# Patient Record
Sex: Male | Born: 1985 | Race: Black or African American | Hispanic: No | Marital: Married | Smoking: Current every day smoker
Health system: Southern US, Community
[De-identification: ages and names within clinical notes are randomized; demographics above are authoritative.]

## PROBLEM LIST (undated history)

## (undated) DIAGNOSIS — J45909 Unspecified asthma, uncomplicated: Secondary | ICD-10-CM

---
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
## 2004-03-18 ENCOUNTER — Emergency Department (HOSPITAL_COMMUNITY): Admission: EM | Admit: 2004-03-18 | Discharge: 2004-03-18 | Payer: Self-pay | Admitting: *Deleted

## 2005-06-26 ENCOUNTER — Emergency Department (HOSPITAL_COMMUNITY): Admission: EM | Admit: 2005-06-26 | Discharge: 2005-06-27 | Payer: Self-pay | Admitting: Emergency Medicine

## 2006-04-02 ENCOUNTER — Emergency Department (HOSPITAL_COMMUNITY): Admission: EM | Admit: 2006-04-02 | Discharge: 2006-04-02 | Payer: Self-pay | Admitting: Emergency Medicine

## 2006-04-08 ENCOUNTER — Emergency Department (HOSPITAL_COMMUNITY): Admission: EM | Admit: 2006-04-08 | Discharge: 2006-04-08 | Payer: Self-pay | Admitting: Emergency Medicine

## 2008-09-07 ENCOUNTER — Emergency Department (HOSPITAL_COMMUNITY): Admission: EM | Admit: 2008-09-07 | Discharge: 2008-09-07 | Payer: Self-pay | Admitting: Family Medicine

## 2011-03-10 ENCOUNTER — Encounter (HOSPITAL_COMMUNITY): Payer: Self-pay | Admitting: Family Medicine

## 2011-03-10 ENCOUNTER — Emergency Department (HOSPITAL_COMMUNITY)
Admission: EM | Admit: 2011-03-10 | Discharge: 2011-03-10 | Discharge status: Against Medical Advice | Payer: Medicaid Other | Attending: Emergency Medicine | Admitting: Emergency Medicine

## 2011-03-10 DIAGNOSIS — F17200 Nicotine dependence, unspecified, uncomplicated: Secondary | ICD-10-CM | POA: Insufficient documentation

## 2011-03-10 DIAGNOSIS — M7989 Other specified soft tissue disorders: Secondary | ICD-10-CM | POA: Insufficient documentation

## 2011-03-10 NOTE — ED Notes (Signed)
Per pt leg swelling for 3 days. Denies any other symptoms

## 2011-03-10 NOTE — ED Notes (Signed)
Pt called with no response.  Will call in 5 minutes.

## 2011-03-10 NOTE — ED Provider Notes (Signed)
History     CSN: [id number]  Arrival date & time 03/10/11  1847   None     Chief Complaint  Patient presents with  . Leg Swelling    (Consider location/radiation/quality/duration/timing/severity/associated sxs/prior treatment) HPI  History reviewed. No pertinent past medical history.  History reviewed. No pertinent past surgical history.  History reviewed. No pertinent family history.  History  Substance Use Topics  . Smoking status: Current Everyday Smoker  . Smokeless tobacco: Not on file  . Alcohol Use: Yes      Review of Systems  Allergies  Review of patient's allergies indicates no known allergies.  Home Medications  No current outpatient prescriptions on file.  BP 116/69  Pulse 89  Temp(Src) 98.1 F (36.7 C) (Oral)  Resp 20  SpO2 98%  Physical Exam  ED Course  Procedures (including critical care time)  Labs Reviewed - No data to display No results found.   No diagnosis found.    MDM  9:50pm I have personally into the room 3 times and the patient is nowhere to be found. We will proceed to take him out of the computer because he's not responding to our request and triaged. I never saw this patient.        Jethro Bastos, NP 03/11/11 1948

## 2011-03-12 NOTE — ED Provider Notes (Signed)
Medical screening examination/treatment/procedure(s) were performed by non-physician practitioner and as supervising physician I was immediately available for consultation/collaboration.   Joshuajames Moehring L Aniello Christopoulos, MD 03/12/11 1433 

## 2011-04-03 ENCOUNTER — Emergency Department (INDEPENDENT_AMBULATORY_CARE_PROVIDER_SITE_OTHER)
Admission: EM | Admit: 2011-04-03 | Discharge: 2011-04-03 | Disposition: A | Discharge status: Home / Self Care | Payer: Self-pay | Source: Home / Self Care | Attending: Emergency Medicine | Admitting: Emergency Medicine

## 2011-04-03 ENCOUNTER — Encounter (HOSPITAL_COMMUNITY): Payer: Self-pay | Admitting: *Deleted

## 2011-04-03 DIAGNOSIS — H6120 Impacted cerumen, unspecified ear: Secondary | ICD-10-CM

## 2011-04-03 DIAGNOSIS — J020 Streptococcal pharyngitis: Secondary | ICD-10-CM

## 2011-04-03 LAB — POCT RAPID STREP A: Streptococcus, Group A Screen (Direct): POSITIVE — AB

## 2011-04-03 MED ORDER — PENICILLIN V POTASSIUM 500 MG PO TABS: 500.0000 mg | ORAL_TABLET | Freq: Four times a day (QID) | ORAL | Status: AC

## 2011-04-03 NOTE — ED Provider Notes (Signed)
Chief Complaint  Patient presents with  . Sore Throat    History of Present Illness:   The patient is a 26 year old male who has had a five-day history of sore throat, pain on swallowing, swollen glands, sweats, nasal congestion, rhinorrhea, yellow drainage, sneezing, left ear pain and congestion.  Review of Systems:  Other than noted above, the patient denies any of the following symptoms. Systemic:  No fever, chills, sweats, fatigue, myalgias, headache, or anorexia. Eye:  No redness, pain or drainage. ENT:  No earache, nasal congestion, rhinorrhea, sinus pressure, or sore throat. Lungs:  No cough, sputum production, wheezing, shortness of breath. Or chest pain. GI:  No nausea, vomiting, abdominal pain or diarrhea. Skin:  No rash or itching.  PMFSH:  Past medical history, family history, social history, meds, and allergies were reviewed.  Physical Exam:   Vital signs:  BP 113/68  Pulse 76  Temp(Src) 98.6 F (37 C) (Oral)  Resp 16  SpO2 98% General:  Alert, in no distress. Eye:  No conjunctival injection or drainage. ENT:  There is a cerumen impaction in the left ear canal. This was irrigated clear and thereafter the tympanic membrane appeared normal. There was some bleeding after irrigation of the ear. The right TM and canal were normal.  Nasal mucosa was clear and uncongested, without drainage.  Mucous membranes were moist.  Tonsils were enlarged and red with spots of whitish exudate particularly on the right side and there was thick, yellow postnasal drip.  There were no oral ulcerations or lesions. Neck:  Supple, no adenopathy, tenderness or mass. Lungs:  No respiratory distress.  Lungs were clear to auscultation, without wheezes, rales or rhonchi.  Breath sounds were clear and equal bilaterally. Heart:  Regular rhythm, without gallops, murmers or rubs. Skin:  Clear, warm, and dry, without rash or lesions.  Labs:   Results for orders placed during the hospital encounter of  04/03/11  POCT RAPID STREP A ([facility] ONLY)      Component Value Range   Streptococcus, Group A Screen (Direct) POSITIVE (*) NEGATIVE      Course in Urgent Care Center:   The left ear canal was irrigated clear. Thereafter the TM appeared normal but there was some bleeding from the ear canal.    Assessment:   Diagnoses that have been ruled out:  None  Diagnoses that are still under consideration:  None  Final diagnoses:  Strep throat  Impacted cerumen      Plan:   1.  The following meds were prescribed:   New Prescriptions   PENICILLIN V POTASSIUM (VEETID) 500 MG TABLET    Take 1 tablet (500 mg total) by mouth 4 (four) times daily.   2.  The patient was instructed in symptomatic care and handouts were given. 3.  The patient was told to return if becoming worse in any way, if no better in 3 or 4 days, and given some red flag symptoms that would indicate earlier return.   Reuben Likes, MD 04/03/11 [phone]

## 2011-04-03 NOTE — ED Notes (Signed)
Jason Johnston  Has  Symptoms  Of  sorethroat  Cough  l  Earache  And  A  Cough  X   4  Days        Sitting upright on  Exam table        speaking in  Complete  sentances

## 2011-04-03 NOTE — Discharge Instructions (Signed)
Cerumen Impaction A cerumen impaction is when the wax in your ear forms a plug. This plug usually causes reduced hearing. Sometimes it also causes an earache or dizziness. Removing a cerumen impaction can be difficult and painful. The wax sticks to the ear canal. The canal is sensitive and bleeds easily. If you try to remove a heavy wax buildup with a cotton tipped swab, you may push it in further. Irrigation with water, suction, and small ear curettes may be used to clear out the wax. If the impaction is fixed to the skin in the ear canal, ear drops may be needed for a few days to loosen the wax. People who build up a lot of wax frequently can use ear wax removal products available in your local drugstore. SEEK MEDICAL CARE IF:  You develop an earache, increased hearing loss, or marked dizziness. Document Released: 02/15/2004 Document Revised: 12/27/2010 Document Reviewed: 04/06/2009 [facility] Patient Information 2012 [place], [place].Strep Throat Strep throat is an infection of the throat caused by a bacteria named Streptococcus pyogenes. Your caregiver may call the infection streptococcal "tonsillitis" or "pharyngitis" depending on whether there are signs of inflammation in the tonsils or back of the throat. Strep throat is most common in children from 26 to 90 years old during the cold months of the year, but it can occur in people of any age during any season. This infection is spread from person to person (contagious) through coughing, sneezing, or other close contact. SYMPTOMS   Fever or chills.   Painful, swollen, red tonsils or throat.   Pain or difficulty when swallowing.   White or yellow spots on the tonsils or throat.   Swollen, tender lymph nodes or "glands" of the neck or under the jaw.   Red rash all over the body (rare).  DIAGNOSIS  Many different infections can cause the same symptoms. A test must be done to confirm the diagnosis so the right treatment can be given. A "rapid  strep test" can help your caregiver make the diagnosis in a few minutes. If this test is not available, a light swab of the infected area can be used for a throat culture test. If a throat culture test is done, results are usually available in a day or two. TREATMENT  Strep throat is treated with antibiotic medicine. HOME CARE INSTRUCTIONS   Gargle with 1 tsp of salt in 1 cup of warm water, 3 to 4 times per day or as needed for comfort.   Family members who also have a sore throat or fever should be tested for strep throat and treated with antibiotics if they have the strep infection.   Make sure everyone in your household washes their hands well.   Do not share food, drinking cups, or personal items that could cause the infection to spread to others.   You may need to eat a soft food diet until your sore throat gets better.   Drink enough water and fluids to keep your urine clear or pale yellow. This will help prevent dehydration.   Get plenty of rest.   Stay home from school, daycare, or work until you have been on antibiotics for 24 hours.   Only take over-the-counter or prescription medicines for pain, discomfort, or fever as directed by your caregiver.   If antibiotics are prescribed, take them as directed. Finish them even if you start to feel better.  SEEK MEDICAL CARE IF:   The glands in your neck continue to enlarge.  You develop a rash, cough, or earache.   You cough up green, yellow-brown, or bloody sputum.   You have pain or discomfort not controlled by medicines.   Your problems seem to be getting worse rather than better.  SEEK IMMEDIATE MEDICAL CARE IF:   You develop any new symptoms such as vomiting, severe headache, stiff or painful neck, chest pain, shortness of breath, or trouble swallowing.   You develop severe throat pain, drooling, or changes in your voice.   You develop swelling of the neck, or the skin on the neck becomes red and tender.   You have  a fever.   You develop signs of dehydration, such as fatigue, dry mouth, and decreased urination.   You become increasingly sleepy, or you cannot wake up completely.  Document Released: 01/05/2000 Document Revised: 12/27/2010 Document Reviewed: 03/08/2010 [employer] Patient Information 2012 [place], [place].

## 2012-04-10 ENCOUNTER — Emergency Department (HOSPITAL_COMMUNITY)
Admission: EM | Admit: 2012-04-10 | Discharge: 2012-04-10 | Disposition: A | Discharge status: Home / Self Care | Payer: BC Managed Care – PPO | Attending: Emergency Medicine | Admitting: Emergency Medicine

## 2012-04-10 ENCOUNTER — Encounter (HOSPITAL_COMMUNITY): Payer: Self-pay | Admitting: *Deleted

## 2012-04-10 DIAGNOSIS — H6692 Otitis media, unspecified, left ear: Secondary | ICD-10-CM

## 2012-04-10 DIAGNOSIS — H6690 Otitis media, unspecified, unspecified ear: Secondary | ICD-10-CM | POA: Insufficient documentation

## 2012-04-10 MED ORDER — IBUPROFEN 400 MG PO TABS
800.0000 mg | ORAL_TABLET | Freq: Once | ORAL | Status: AC
  Administered 2012-04-10: 800 mg via ORAL
  Filled 2012-04-10: qty 2

## 2012-04-10 MED ORDER — AMOXICILLIN 500 MG PO CAPS: 500.0000 mg | ORAL_CAPSULE | Freq: Two times a day (BID) | ORAL | Status: DC

## 2012-04-10 NOTE — Discharge Instructions (Signed)
Otitis Media, Adult A middle ear infection is an infection in the space behind the eardrum. The medical name for this is "otitis media." It may happen after a common cold. It is caused by a germ that starts growing in that space. You may feel swollen glands in your neck on the side of the ear infection. HOME CARE INSTRUCTIONS   Take your medicine as directed until it is gone, even if you feel better after the first few days.  Only take over-the-counter or prescription medicines for pain, discomfort, or fever as directed by your caregiver.  Occasional use of a nasal decongestant a couple times per day may help with discomfort and help the eustachian tube to drain better. Follow up with your caregiver in 10 to 14 days or as directed, to be certain that the infection has cleared. Not keeping the appointment could result in a chronic or permanent injury, pain, hearing loss and disability. If there is any problem keeping the appointment, you must call back to this facility for assistance. SEEK IMMEDIATE MEDICAL CARE IF:   You are not getting better in 2 to 3 days.  You have pain that is not controlled with medication.  You feel worse instead of better.  You cannot use the medication as directed.  You develop swelling, redness or pain around the ear or stiffness in your neck. MAKE SURE YOU:   Understand these instructions.  Will watch your condition.  Will get help right away if you are not doing well or get worse. Document Released: 10/13/2003 Document Revised: 04/01/2011 Document Reviewed: 08/14/2007 [facility] Patient Information 2013 [place], [place].  Antibiotic Nonuse  Your caregiver felt that the infection or problem was not one that would be helped with an antibiotic. Infections may be caused by viruses or bacteria. Only a caregiver can tell which one of these is the likely cause of an illness. A cold is the most common cause of infection in both adults and children. A cold is a  virus. Antibiotic treatment will have no effect on a viral infection. Viruses can lead to many lost days of work caring for sick children and many missed days of school. Children may catch as many as 10 "colds" or "flus" per year during which they can be tearful, cranky, and uncomfortable. The goal of treating a virus is aimed at keeping the ill person comfortable. Antibiotics are medications used to help the body fight bacterial infections. There are relatively few types of bacteria that cause infections but there are hundreds of viruses. While both viruses and bacteria cause infection they are very different types of germs. A viral infection will typically go away by itself within 7 to 10 days. Bacterial infections may spread or get worse without antibiotic treatment. Examples of bacterial infections are:  Sore throats (like strep throat or tonsillitis).  Infection in the lung (pneumonia).  Ear and skin infections. Examples of viral infections are:  Colds or flus.  Most coughs and bronchitis.  Sore throats not caused by Strep.  Runny noses. It is often best not to take an antibiotic when a viral infection is the cause of the problem. Antibiotics can kill off the helpful bacteria that we have inside our body and allow harmful bacteria to start growing. Antibiotics can cause side effects such as allergies, nausea, and diarrhea without helping to improve the symptoms of the viral infection. Additionally, repeated uses of antibiotics can cause bacteria inside of our body to become resistant. That resistance can be  passed onto harmful bacterial. The next time you have an infection it may be harder to treat if antibiotics are used when they are not needed. Not treating with antibiotics allows our own immune system to develop and take care of infections more efficiently. Also, antibiotics will work better for [place] when they are prescribed for bacterial infections. Treatments for a child that is ill may  include:  Give extra fluids throughout the day to stay hydrated.  Get plenty of rest.  Only give your child over-the-counter or prescription medicines for pain, discomfort, or fever as directed by your caregiver.  The use of a cool mist humidifier may help stuffy noses.  Cold medications if suggested by your caregiver. Your caregiver may decide to start you on an antibiotic if:  The problem you were seen for today continues for a longer length of time than expected.  You develop a secondary bacterial infection. SEEK MEDICAL CARE IF:  Fever lasts longer than 5 days.  Symptoms continue to get worse after 5 to 7 days or become severe.  Difficulty in breathing develops.  Signs of dehydration develop (poor drinking, rare urinating, dark colored urine).  Changes in behavior or worsening tiredness (listlessness or lethargy). Document Released: 03/18/2001 Document Revised: 04/01/2011 Document Reviewed: 09/14/2008 [facility] Patient Information 2013 [place], [place].  RESOURCE GUIDE  Chronic Pain Problems: Contact [facility]  [phone] Patients need to be referred by their primary care doctor.  Insufficient Money for Medicine: Contact United Way:  call "211."   No Primary Care Doctor: - Call Health Connect  [phone] - can help you locate a primary care doctor that  accepts your insurance, provides certain services, etc. - Physician Referral Service- [phone]  Agencies that provide inexpensive medical care: - [facility]  [phone] - [facility]  [phone] - [facility]  [phone] - Women's Clinic  [phone] - Planned Parenthood  [phone] [facility]  [phone]  Medicaid-accepting [facility] Providers: - [facility]- [address]  [phone], Mon-Fri 9am-7pm, Sat 9am-1pm - [facility]- [address] [place], [address] [place]  [phone] - [facility]- [address], [address] [place]  [phone] [facility]- [address]  [phone] - Renaye Rakers- [address] [place], [address], [phone]  Only accepts [place] patients after they have their name  applied to their card  Self Pay (no insurance) in [place]: - Sickle Cell Patients: Dr Willey Blade, [facility]  [address] [place], [phone] - [facility]- [address] [place]  [phone]       [facility]- [address]       -     [facility]- see information above (Speak to [employer] if you do not have insurance)       -  [facility]- [address],  [phone]       -  [facility]- [address], [phone]       -  Dr Julio Sicks-  [address], [place], [phone]       -  [facility]gent [facility] - [address] [phone]       -  [facility]- [address] [phone], also [address]  [address] [phone]       -    [facility]- [address] [place], [phone], 1st & 3rd Saturday        every month, 10am-1pm  [facility] [address] [place], [place] [address] [phone]  [facility] [address] [place], [place] [address] [phone]  1) Find a Doctor and Pay Out of Pocket Although you won't have to find out who is covered by your insurance plan, it is a good idea to ask around and get recommendations. You will then need to call the office and see if the doctor you have chosen will accept you as a new patient and what types of options they offer for patients who are self-pay. Some doctors offer discounts or will set up payment plans for their patients who do not have insurance, but you will need to ask so you aren't surprised when you get to your appointment.  2) Contact Your Local Health Department Not all health  departments have doctors that can see patients for sick visits, but many do, so it is worth a call to see if yours does. If you don't know where your local health department is, you can check in your phone book. The CDC also has a tool to help you locate your state's health department, and many state websites also have listings of all of their local health departments.  3) Find a Walk-in Clinic If your illness is not likely to be very severe or complicated, you may want to try a walk in clinic. These are popping up all over the country in pharmacies, drugstores, and shopping centers. They're usually staffed by nurse practitioners or physician assistants that have been trained to treat common illnesses and complaints. They're usually fairly quick and inexpensive. However, if you have serious medical issues or chronic medical problems, these are probably not your best option  STD Testing - [facility], STD Clinic, [address] [place], phone [phone] or [phone].  Monday - Friday, call for an appointment. [facility] of [employer], STD Clinic, [address], [place], phone [phone] or [phone].  Monday - Friday, call for an appointment.  Abuse/Neglect: [facility] Child Abuse Hotline 3431208179 [facility] Child Abuse Hotline [phone] (After Hours)  Emergency Shelter:  [facility] [phone]  Maternity Homes: - [facility] [place] [facility] [phone] - [facility] [phone]  MRSA Hotline #:   813 676 8875  Dental Assistance If unable to pay or uninsured, contact:  [facility]. to become qualified for the adult dental clinic.  Patients with Medicaid: [facility] [phone] [address], [phone] [address] [phone]  If unable to pay, or uninsured, contact [facility] [phone] in [place], [phone] in [facility]) to become qualified for the adult dental clinic  [facility] [address] [place], [place] [address] [phone] [email or web address]  Other Proofreader Services: - [facility]- [address] [place], [place], [place], [address], [phone], Ext. [phone], 2nd and 4th Thursday of the month at 6:30am.  10 clients each day by appointment, can sometimes see walk-in patients if someone does not show for an appointment. [facility]- [address] [place], [place], [address], [phone] - [facility]- [address] [place], [place], [address], [phone] -  [facility] Health Department- [phone] - [facility] Health Department- [phone] [facility] Health [phone]       Behavioral Health Resources in the [facility]  Intensive Outpatient Programs: [facility]      [address] [place], [place] [phone] Both a day and evening program       [facility] Outpatient     [address]        [place], [place] [address] [phone]         ADS: [facility] [address] [place] [phone]  [facility] ACCESS LINE: [phone] or [phone] [address] [place], [place] [address] [email or web address]   Substance Abuse Resources: - [facility]  [phone] - [facility] [phone] - [facility] [phone] Floydene Flock [phone] - Residential & Outpatient Substance Abuse Program  [phone]  Psychological Services: [facility]  [phone] [facility]  [phone] - [facility], [phone] [place]. [address] [place], ACCESS LINE: [phone] or [phone], [email or web address]  Mobile Crisis Teams:                                         Therapeutic Alternatives         Mobile Crisis Care Unit [phone]             [facility] [address] [place]ette Otto [phone]                                         Interventionist [address] [place] [phone]  Self-Help/Support Groups: Mental Health Assoc. of [employer] of support groups [phone] (call for more info)   Narcotics Anonymous (NA) Caring [facility] [address] [place] - 2 meetings at this location  Residential Treatment Programs:  [facility]      [address]        [place]       [phone]         [facility] [address], [place] [address]83 [place], [place]  [address] [phone]  [facility]  [address] [place], [place] [address] [phone] Admissions: 8am-3pm M-F  [facility]     [address]        [place], [place] [address]       [phone]         [facility] [address] [place], [place] [phone]  [facility] [address] [place], [place] [phone]  [facility] - Intensive Outpatient      [address]     [place], [place]       [phone]         [facility] ([facility]     [address] [place], [place] [phone] or [phone]  Residential Treatment Services (RTS), Medicaid [address] [place], [place] [phone]  [facility] Margo Aye  [address] [place] [phone]  [facility] Resources: [phone]               General Therapy                                                Angie Fava, PhD        [address] [place], [place] [address]         [phone]   Insurance  [facility]   [address] [place], [place] [address] [phone]  [facility] [address] [place], [place] [address] [phone] Insurance/Medicaid/sponsorship through [facility]                                              [address]                                        [place], [place] [address]    Therapy/tele-psych/case         [phone]          [facility] [address], [place]  [address]  Adolescent/group home/case management [phone]                                           Creola Corn PhD       General therapy       Insurance   [phone]         Dr. Lolly Mustache, Insurance, M-F [phone]  [facility] [employer]  [employer] [facility]. [address]                 [address]         [address] [place] [address]  Blondell Reveal Phone:  [phone]                                  Phone:  [phone]                   Phone:  [phone]  [facility], [phone] - [facility] - [phone]       -     Tressie Ellis  [facility] in [place], [address],             [place] [phone] or [phone] (After Hours)

## 2012-04-10 NOTE — ED Provider Notes (Signed)
History     CSN: [id number]  Arrival date & time 04/10/12  2010   First MD Initiated Contact with Patient 04/10/12 2133      Chief Complaint  Patient presents with  . Otalgia    (Consider location/radiation/quality/duration/timing/severity/associated sxs/prior Treatment)  SUBJECTIVE: Jason Johnston is a 27 y.o. male brought by himself with 2 week(s) history of left ear pain, no fever, no drainage, persistent pain on left, without drainage. Pain is not radiating to the jaw or behind the ear. Denies fevers, chills, nausea, vomiting, diarrhea, hearing loss, mastoid swelling, cervical lympadenopathy, trismus. No history of recurrent ear infections.   Patient is a 27 y.o. male presenting with ear pain.  Otalgia   No past medical history on file.  No past surgical history on file.  No family history on file.  History  Substance Use Topics  . Smoking status: Current Every Day Smoker    Types: Cigarettes  . Smokeless tobacco: Not on file  . Alcohol Use: Yes      Review of Systems  Constitutional: Negative for chills and appetite change.  HENT: Positive for ear pain.   Eyes: Negative for pain.  Musculoskeletal: Negative for back pain.  Neurological: Negative for dizziness and light-headedness.   Physical exam BP 124/91  Pulse 65  Temp(Src) 98 F (36.7 C) (Oral)  Resp 16  SpO2 96% General appearance: alert, well appearing, and in no distress and oriented to person, place, and time.   Ears: right ear normal, left TM red, dull, bulging with air fluid level noted, hearing grossly normal bilaterally. No pinna displacement bilaterally. No mastoid tenderness noted.  Nose: normal and patent, no erythema, discharge or polyps Oropharynx: mucous membranes moist, pharynx normal without lesions, tongue normal, TMJ exam normal, no tenderness, normal excursion and left third to last molar is fracture (happened 2 years ago) No erythematous gums, discharge, indurated or fluctuant gums  noted. Neck: supple, no significant adenopathy Lungs: clear to auscultation, no wheezes, rales or rhonchi, symmetric air entry  ASSESSMENT: Otitis Media  PLAN: 1) See orders for this visit as documented in the electronic medical record. 2) Symptomatic therapy suggested: use ibuprofen, antihistamine-decongestant of choice prn.  3) Call or return to clinic prn if these symptoms worsen or fail to improve as anticipated.    Allergies  Review of patient's allergies indicates no known allergies.  Home Medications  No current outpatient prescriptions on file.  BP 124/91  Pulse 65  Temp(Src) 98 F (36.7 C) (Oral)  Resp 16  SpO2 96%  Physical Exam  ED Course  Procedures (including critical care time)  Labs Reviewed - No data to display No results found.   1. Otitis media, left       MDM  Patient is a 27 yo M presenting with Left ear pain x 2 weeks. Patient denies hearing loss, drainage, trauma to the ear, or recurrent ear infections. The right TM was grossly normal while the left TM was dull, red, bulging with air fluid level noted. No blisters noted on TM. Patient was discharged with Antibiotic therapy and told to use OTC pain medications as directed for analegsia. Patient was provided resource guide to find a PCP to follow up with in 1-2 days. Patient was advised to return if pain worsened, developed fever, or any other concerning symptoms developed. Patient was stable at time of discharge.   Jeannetta Ellis, PA-C 04/10/12 2212

## 2012-04-10 NOTE — ED Notes (Signed)
Pt c/o left ear pain x 2 weeks, denies fever, n/v.

## 2012-04-11 NOTE — ED Provider Notes (Signed)
Medical screening examination/treatment/procedure(s) were performed by non-physician practitioner and as supervising physician I was immediately available for consultation/collaboration.   Hamed Debella L Muranda Coye, MD 04/11/12 1229 

## 2012-05-27 ENCOUNTER — Encounter (HOSPITAL_COMMUNITY): Payer: Self-pay

## 2012-05-27 ENCOUNTER — Emergency Department (HOSPITAL_COMMUNITY)
Admission: EM | Admit: 2012-05-27 | Discharge: 2012-05-27 | Disposition: A | Discharge status: Home / Self Care | Payer: BC Managed Care – PPO | Source: Home / Self Care | Attending: Family Medicine | Admitting: Family Medicine

## 2012-05-27 DIAGNOSIS — J40 Bronchitis, not specified as acute or chronic: Secondary | ICD-10-CM

## 2012-05-27 MED ORDER — IPRATROPIUM BROMIDE 0.02 % IN SOLN
0.5000 mg | Freq: Once | RESPIRATORY_TRACT | Status: AC
  Administered 2012-05-27: 0.5 mg via RESPIRATORY_TRACT

## 2012-05-27 MED ORDER — DOXYCYCLINE HYCLATE 100 MG PO CAPS: 100.0000 mg | ORAL_CAPSULE | Freq: Two times a day (BID) | ORAL | Status: DC

## 2012-05-27 MED ORDER — ALBUTEROL SULFATE HFA 108 (90 BASE) MCG/ACT IN AERS: 1.0000 | INHALATION_SPRAY | Freq: Four times a day (QID) | RESPIRATORY_TRACT | Status: DC | PRN

## 2012-05-27 MED ORDER — FEXOFENADINE-PSEUDOEPHED ER 60-120 MG PO TB12: 1.0000 | ORAL_TABLET | Freq: Two times a day (BID) | ORAL | Status: DC

## 2012-05-27 MED ORDER — ALBUTEROL SULFATE (5 MG/ML) 0.5% IN NEBU
5.0000 mg | INHALATION_SOLUTION | Freq: Once | RESPIRATORY_TRACT | Status: AC
  Administered 2012-05-27: 5 mg via RESPIRATORY_TRACT

## 2012-05-27 MED ORDER — METHYLPREDNISOLONE SODIUM SUCC 125 MG IJ SOLR
INTRAMUSCULAR | Status: AC
  Filled 2012-05-27: qty 2

## 2012-05-27 MED ORDER — ALBUTEROL SULFATE (5 MG/ML) 0.5% IN NEBU
INHALATION_SOLUTION | RESPIRATORY_TRACT | Status: AC
  Filled 2012-05-27: qty 1

## 2012-05-27 MED ORDER — GUAIFENESIN-CODEINE 100-10 MG/5ML PO SYRP: 5.0000 mL | ORAL_SOLUTION | Freq: Three times a day (TID) | ORAL | Status: DC | PRN

## 2012-05-27 MED ORDER — METHYLPREDNISOLONE SODIUM SUCC 125 MG IJ SOLR
125.0000 mg | Freq: Once | INTRAMUSCULAR | Status: AC
  Administered 2012-05-27: 125 mg via INTRAMUSCULAR

## 2012-05-27 MED ORDER — PREDNISONE 20 MG PO TABS: ORAL_TABLET | ORAL | Status: DC

## 2012-05-27 NOTE — ED Provider Notes (Signed)
History     CSN: [id number]  Arrival date & time 05/27/12  1840   First MD Initiated Contact with Patient 05/27/12 1924      Chief Complaint  Patient presents with  . Shortness of Breath    (Consider location/radiation/quality/duration/timing/severity/associated sxs/prior treatment) HPI Comments: 27 year old smoker male here complaining of productive cough, general malaise, shortness of breath and difficulty breathing in the last 4 days. Symptoms worse today. Not taking any medication for his symptoms. He was treated a month ago with amoxicillin for left otitis media. Denies fever although reports chills and episodes of sweat. Reports one episode of posttussive emesis and has decreased appetite. Tolerating solids and fluids today.  Cough is worse at nighttime. He has not smoked since Saturday (4 days ago).   History reviewed. No pertinent past medical history.  History reviewed. No pertinent past surgical history.  History reviewed. No pertinent family history.  History  Substance Use Topics  . Smoking status: Current Every Day Smoker    Types: Cigarettes  . Smokeless tobacco: Not on file  . Alcohol Use: Yes      Review of Systems  Constitutional: Positive for chills and appetite change. Negative for fatigue.  HENT: Positive for congestion, rhinorrhea, sneezing and sinus pressure. Negative for sore throat and trouble swallowing.   Respiratory: Positive for cough, shortness of breath and wheezing.   Cardiovascular: Negative for chest pain.  Gastrointestinal: Positive for nausea and vomiting. Negative for abdominal pain and diarrhea.  Skin: Negative for rash.  Neurological: Positive for headaches. Negative for dizziness.  All other systems reviewed and are negative.    Allergies  Review of patient's allergies indicates no known allergies.  Home Medications   Current Outpatient Rx  Name  Route  Sig  Dispense  Refill  . albuterol (PROVENTIL HFA;VENTOLIN HFA) 108 (90  BASE) MCG/ACT inhaler   Inhalation   Inhale 1-2 puffs into the lungs every 6 (six) hours as needed for wheezing.   1 Inhaler   0   . benzocaine (ORAJEL) 10 % mucosal gel   Mouth/Throat   Use as directed 1 application in the mouth or throat as needed for pain. For dental pain         . doxycycline (VIBRAMYCIN) 100 MG capsule   Oral   Take 1 capsule (100 mg total) by mouth 2 (two) times daily.   20 capsule   0   . fexofenadine-pseudoephedrine (ALLEGRA-D) 60-120 MG per tablet   Oral   Take 1 tablet by mouth every 12 (twelve) hours.   30 tablet   0   . guaiFENesin-codeine (ROBITUSSIN AC) 100-10 MG/5ML syrup   Oral   Take 5 mLs by mouth 3 (three) times daily as needed for cough.   120 mL   0   . predniSONE (DELTASONE) 20 MG tablet      2 tabs by mouth daily for 5 days   10 tablet   0     BP 161/83  Pulse 74  Temp(Src) 98.4 F (36.9 C) (Oral)  Resp 18  Physical Exam  Nursing note and vitals reviewed. Constitutional: He is oriented to person, place, and time. He appears well-developed and well-nourished. No distress.  HENT:  Head: Normocephalic and atraumatic.  Right Ear: External ear normal.  Left Ear: External ear normal.  Mouth/Throat: Oropharynx is clear and moist. No oropharyngeal exudate.  Eyes: Conjunctivae are normal. Right eye exhibits no discharge. Left eye exhibits no discharge. No scleral icterus.  Neck: Neck supple.  No JVD present.  Cardiovascular: Normal rate, regular rhythm and normal heart sounds.   Pulmonary/Chest:  Bronchitic cough. Prolonged expiration. Bilateral rhonchi. No Rales. No active wheezing. No tachypnea no orthopnea. No respiratory distress.  Lymphadenopathy:    He has no cervical adenopathy.  Neurological: He is alert and oriented to person, place, and time.  Skin: No rash noted. He is not diaphoretic.    ED Course  Procedures (including critical care time)  Labs Reviewed - No data to display No results found.   1.  Bronchitis       MDM  Oxygen saturation 93% on arrival. Treated with Solu-Medrol 125 mg IM x1 and albuterol/ipratropium nebulization x2 oxygen saturation improved to 95% ambulatory without oxygen. Lungs clear after second neb. Prescribed doxycycline, prednisone, guaifenesin/codeine. Supportive care and red flags that should prompt his return to medical attention discussed with patient and provided in writing.        Sharin Grave, MD 05/29/12 [phone]

## 2012-05-27 NOTE — ED Notes (Signed)
Recent treatment for bronchitis , feels he never got completely over illness; coarse breath sounds throughout chest ; had been sweating off and on today; c/o fatigue

## 2012-05-27 NOTE — ED Notes (Signed)
After 1st treatment, pt states he felt some better, but continues to have wheezing bilateral lung fields

## 2012-10-03 ENCOUNTER — Encounter (HOSPITAL_COMMUNITY): Payer: Self-pay | Admitting: Emergency Medicine

## 2012-10-03 ENCOUNTER — Emergency Department (HOSPITAL_COMMUNITY)
Admission: EM | Admit: 2012-10-03 | Discharge: 2012-10-04 | Disposition: A | Discharge status: Home / Self Care | Payer: BC Managed Care – PPO | Attending: Emergency Medicine | Admitting: Emergency Medicine

## 2012-10-03 DIAGNOSIS — R197 Diarrhea, unspecified: Secondary | ICD-10-CM | POA: Insufficient documentation

## 2012-10-03 DIAGNOSIS — K2970 Gastritis, unspecified, without bleeding: Secondary | ICD-10-CM | POA: Insufficient documentation

## 2012-10-03 DIAGNOSIS — F17200 Nicotine dependence, unspecified, uncomplicated: Secondary | ICD-10-CM | POA: Insufficient documentation

## 2012-10-03 DIAGNOSIS — Z79899 Other long term (current) drug therapy: Secondary | ICD-10-CM | POA: Insufficient documentation

## 2012-10-03 DIAGNOSIS — J45909 Unspecified asthma, uncomplicated: Secondary | ICD-10-CM | POA: Insufficient documentation

## 2012-10-03 HISTORY — DX: Unspecified asthma, uncomplicated: J45.909

## 2012-10-03 LAB — BASIC METABOLIC PANEL
BUN: 14 mg/dL (ref 6–23)
CO2: 26 mEq/L (ref 19–32)
Calcium: 8.9 mg/dL (ref 8.4–10.5)
Chloride: 104 mEq/L (ref 96–112)
Creatinine, Ser: 1.02 mg/dL (ref 0.50–1.35)
GFR calc Af Amer: >90 mL/min (ref >90)
GFR calc non Af Amer: >90 mL/min (ref >90)
Glucose, Bld: 120 mg/dL — ABNORMAL HIGH (ref 70–99)
Potassium: 3.7 mEq/L (ref 3.5–5.1)
Sodium: 140 mEq/L (ref 135–145)

## 2012-10-03 LAB — CBC
HCT: 44.3 % (ref 39.0–52.0)
Hemoglobin: 15.6 g/dL (ref 13.0–17.0)
MCH: 31.1 pg (ref 26.0–34.0)
MCHC: 35.2 g/dL (ref 30.0–36.0)
MCV: 88.4 fL (ref 78.0–100.0)
Platelets: 296 10*3/uL (ref 150–400)
RBC: 5.01 MIL/uL (ref 4.22–5.81)
RDW: 12.7 % (ref 11.5–15.5)
WBC: 11.2 10*3/uL — ABNORMAL HIGH (ref 4.0–10.5)

## 2012-10-03 LAB — LIPASE, BLOOD: Lipase: 22 U/L (ref 11–59)

## 2012-10-03 MED ORDER — GLYCOPYRROLATE 0.2 MG/ML IJ SOLN
0.2000 mg | Freq: Once | INTRAMUSCULAR | Status: AC
  Administered 2012-10-03: 0.2 mg via INTRAVENOUS
  Filled 2012-10-03: qty 1

## 2012-10-03 MED ORDER — SODIUM CHLORIDE 0.9 % IV BOLUS (SEPSIS)
1000.0000 mL | INTRAVENOUS | Status: AC
  Administered 2012-10-03: 1000 mL via INTRAVENOUS

## 2012-10-03 MED ORDER — PROMETHAZINE HCL 25 MG/ML IJ SOLN
25.0000 mg | Freq: Once | INTRAMUSCULAR | Status: AC
  Administered 2012-10-03: 25 mg via INTRAVENOUS
  Filled 2012-10-03: qty 1

## 2012-10-03 NOTE — ED Provider Notes (Signed)
CSN: [id number]     Arrival date & time 10/03/12  2133 History   First MD Initiated Contact with Patient 10/03/12 2139     Chief Complaint  Patient presents with  . Nausea   (Consider location/radiation/quality/duration/timing/severity/associated sxs/prior Treatment) HPI Pt is a 27yo male with hx of asthma c/o 2 day hx of nausea, vomiting and abdominal pain.  Reports hematemesis, 10-15 episodes of emesis containing food mixed with dark red blood.  Unable to quantify amount of blood, states it was only food yesterday, blood started today.  Pain in abdomen was acute in onset, constant, aching 10/10, worse when vomiting.  Pt also reports 1yo son at home sick with flu-like symptoms and thinks he may have gotten this from him. Reports eating and drinking normally until yesterday, tried Pepto-Bismol w/o relief.  Denies any previous abdominal surgeries. Denies fevers, chest pain, SOB, urinary symptoms or change in BM.  Past Medical History  Diagnosis Date  . Asthma    History reviewed. No pertinent past surgical history. History reviewed. No pertinent family history. History  Substance Use Topics  . Smoking status: Current Every Day Smoker -- 0.50 packs/day    Types: Cigarettes  . Smokeless tobacco: Not on file  . Alcohol Use: 0.6 oz/week    1 Glasses of wine per week    Review of Systems  Constitutional: Negative for fever and chills.  Gastrointestinal: Positive for nausea, vomiting, abdominal pain and diarrhea. Negative for constipation, blood in stool and anal bleeding.  Genitourinary: Negative for dysuria, urgency, frequency, hematuria and flank pain.  All other systems reviewed and are negative.    Allergies  Review of patient's allergies indicates no known allergies.  Home Medications   Current Outpatient Rx  Name  Route  Sig  Dispense  Refill  . albuterol (PROVENTIL HFA;VENTOLIN HFA) 108 (90 BASE) MCG/ACT inhaler   Inhalation   Inhale 1-2 puffs into the lungs every 6 (six)  hours as needed for wheezing.   1 Inhaler   0   . ondansetron (ZOFRAN ODT) 4 MG disintegrating tablet   Oral   Take 1 tablet (4 mg total) by mouth every 8 (eight) hours as needed for nausea.   10 tablet   0    BP 150/91  Pulse 113  Temp(Src) 98 F (36.7 C) (Oral)  Resp 18  SpO2 100% Physical Exam  Nursing note and vitals reviewed. Constitutional: He appears well-developed and well-nourished.  Obese male lying in exam bed, appears fatigued. NAD.  HENT:  Head: Normocephalic and atraumatic.  Eyes: Conjunctivae are normal. No scleral icterus.  Neck: Normal range of motion. Neck supple.  Cardiovascular: Normal rate, regular rhythm and normal heart sounds.   Pulmonary/Chest: Effort normal and breath sounds normal. No respiratory distress. He has no wheezes. He has no rales. He exhibits no tenderness.  Abdominal: Soft. Bowel sounds are normal. He exhibits no distension and no mass. There is tenderness (epigastric and periumbilical region). There is no rebound and no guarding.  Obese abdomen, soft, mild TTP epigastrium and periumbilical region.  Musculoskeletal: Normal range of motion.  Neurological: He is alert.  Skin: Skin is warm and dry.    ED Course  Procedures (including critical care time) Labs Review Labs Reviewed  CBC - Abnormal; Notable for the following:    WBC 11.2 (*)    All other components within normal limits  BASIC METABOLIC PANEL - Abnormal; Notable for the following:    Glucose, Bld 120 (*)    All  other components within normal limits  LIPASE, BLOOD   Imaging Review No results found.  MDM   1. Gastritis    Pt presenting with gastroenteritis type symptoms will tx with phenergan, fluids, and robinul.  Due to reports of hematemesis, will get CBC, BMP, and lipase to ensure everything else is WNL.  Based on Hx (with known sick contacts) and PE, not concerned for surgical abdomen at this time. Do not believe imaging is needed at this time.  Labs:  unremarkable.   12:58 AM Pt is drowsy but did complete fluid challenge.  States he feels comfortable going home. Will discharge pt home and have her f/u with [facility] info provided. Return precautions given. Pt verbalized understanding and agreement with tx plan. Vitals: unremarkable. Discharged in stable condition.    Discussed pt with attending during ED encounter.    Junius Finner, PA-C 10/04/12 [phone]
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
## 2012-10-03 NOTE — ED Notes (Signed)
Bed: ZO10 Expected date:  Expected time:  Means of arrival:  Comments: Pt from triage

## 2012-10-04 MED ORDER — ONDANSETRON HCL 4 MG/2ML IJ SOLN
4.0000 mg | Freq: Once | INTRAMUSCULAR | Status: AC
  Administered 2012-10-04: 4 mg via INTRAVENOUS
  Filled 2012-10-04: qty 2

## 2012-10-04 MED ORDER — ONDANSETRON 4 MG PO TBDP: 4.0000 mg | ORAL_TABLET | Freq: Three times a day (TID) | ORAL | Status: DC | PRN

## 2012-10-04 NOTE — ED Notes (Signed)
Pt is awake and alert, pleasant and cooperative. Patient denies pain.nausea or vomiting at this time Discharge vitals 127/80 HR 87 RR 16 and unlabored. Pt advised to follow-up with PCP. Will continue to monitor for safety. Patient escorted to lobby without incident. T.Melvyn Neth RN

## 2012-10-07 NOTE — ED Provider Notes (Signed)
Medical screening examination/treatment/procedure(s) were performed by non-physician practitioner and as supervising physician I was immediately available for consultation/collaboration.   Cortlandt Capuano Joseph Patches Mcdonnell, MD 10/07/12 0814 

## 2012-11-04 ENCOUNTER — Emergency Department (HOSPITAL_COMMUNITY)
Admission: EM | Admit: 2012-11-04 | Discharge: 2012-11-04 | Discharge status: Against Medical Advice | Payer: BC Managed Care – PPO | Attending: Emergency Medicine | Admitting: Emergency Medicine

## 2012-11-04 ENCOUNTER — Emergency Department (HOSPITAL_COMMUNITY): Payer: BC Managed Care – PPO

## 2012-11-04 ENCOUNTER — Encounter (HOSPITAL_COMMUNITY): Payer: Self-pay | Admitting: Emergency Medicine

## 2012-11-04 DIAGNOSIS — S3981XA Other specified injuries of abdomen, initial encounter: Secondary | ICD-10-CM | POA: Insufficient documentation

## 2012-11-04 DIAGNOSIS — F17200 Nicotine dependence, unspecified, uncomplicated: Secondary | ICD-10-CM | POA: Insufficient documentation

## 2012-11-04 DIAGNOSIS — Z79899 Other long term (current) drug therapy: Secondary | ICD-10-CM | POA: Insufficient documentation

## 2012-11-04 DIAGNOSIS — S301XXA Contusion of abdominal wall, initial encounter: Secondary | ICD-10-CM | POA: Insufficient documentation

## 2012-11-04 DIAGNOSIS — R0781 Pleurodynia: Secondary | ICD-10-CM

## 2012-11-04 DIAGNOSIS — J45909 Unspecified asthma, uncomplicated: Secondary | ICD-10-CM | POA: Insufficient documentation

## 2012-11-04 DIAGNOSIS — S298XXA Other specified injuries of thorax, initial encounter: Secondary | ICD-10-CM | POA: Insufficient documentation

## 2012-11-04 DIAGNOSIS — R109 Unspecified abdominal pain: Secondary | ICD-10-CM

## 2012-11-04 LAB — CBC WITH DIFFERENTIAL/PLATELET
Eosinophils Absolute: 0.4 10*3/uL (ref 0.0–0.7)
HCT: 33.3 % — ABNORMAL LOW (ref 39.0–52.0)
Hemoglobin: 11.3 g/dL — ABNORMAL LOW (ref 13.0–17.0)
Lymphs Abs: 3.4 10*3/uL (ref 0.7–4.0)
MCH: 30.6 pg (ref 26.0–34.0)
Monocytes Absolute: 0.8 10*3/uL (ref 0.1–1.0)
Monocytes Relative: 8 % (ref 3–12)
Neutrophils Relative %: 57 % (ref 43–77)
RBC: 3.69 MIL/uL — ABNORMAL LOW (ref 4.22–5.81)

## 2012-11-04 LAB — BASIC METABOLIC PANEL
BUN: 9 mg/dL (ref 6–23)
Chloride: 105 mEq/L (ref 96–112)
Creatinine, Ser: 0.96 mg/dL (ref 0.50–1.35)
GFR calc non Af Amer: >90 mL/min (ref >90)
Glucose, Bld: 99 mg/dL (ref 70–99)
Potassium: 3.7 mEq/L (ref 3.5–5.1)
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
## 2012-11-05 ENCOUNTER — Emergency Department (HOSPITAL_COMMUNITY)
Admission: EM | Admit: 2012-11-05 | Discharge: 2012-11-05 | Disposition: A | Discharge status: Home / Self Care | Payer: BC Managed Care – PPO | Attending: Emergency Medicine | Admitting: Emergency Medicine

## 2012-11-05 ENCOUNTER — Emergency Department (HOSPITAL_COMMUNITY): Payer: BC Managed Care – PPO

## 2012-11-05 ENCOUNTER — Encounter (HOSPITAL_COMMUNITY): Payer: Self-pay | Admitting: Emergency Medicine

## 2012-11-05 DIAGNOSIS — Z79899 Other long term (current) drug therapy: Secondary | ICD-10-CM | POA: Insufficient documentation

## 2012-11-05 DIAGNOSIS — J45909 Unspecified asthma, uncomplicated: Secondary | ICD-10-CM | POA: Insufficient documentation

## 2012-11-05 DIAGNOSIS — R079 Chest pain, unspecified: Secondary | ICD-10-CM | POA: Insufficient documentation

## 2012-11-05 DIAGNOSIS — S301XXA Contusion of abdominal wall, initial encounter: Secondary | ICD-10-CM | POA: Insufficient documentation

## 2012-11-05 DIAGNOSIS — S301XXD Contusion of abdominal wall, subsequent encounter: Secondary | ICD-10-CM

## 2012-11-05 DIAGNOSIS — F17200 Nicotine dependence, unspecified, uncomplicated: Secondary | ICD-10-CM | POA: Insufficient documentation

## 2012-11-05 LAB — BASIC METABOLIC PANEL
Chloride: 106 mEq/L (ref 96–112)
Creatinine, Ser: 0.96 mg/dL (ref 0.50–1.35)
GFR calc Af Amer: >90 mL/min (ref >90)
Glucose, Bld: 98 mg/dL (ref 70–99)
Potassium: 3.9 mEq/L (ref 3.5–5.1)
Sodium: 139 mEq/L (ref 135–145)

## 2012-11-05 LAB — CBC WITH DIFFERENTIAL/PLATELET
Basophils Absolute: 0 10*3/uL (ref 0.0–0.1)
Basophils Relative: 0 % (ref 0–1)
MCHC: 34.7 g/dL (ref 30.0–36.0)
Neutro Abs: 7.8 10*3/uL — ABNORMAL HIGH (ref 1.7–7.7)
Neutrophils Relative %: 63 % (ref 43–77)
Platelets: 295 10*3/uL (ref 150–400)
RDW: 12.9 % (ref 11.5–15.5)

## 2012-11-05 MED ORDER — OXYCODONE-ACETAMINOPHEN 5-325 MG PO TABS
2.0000 | ORAL_TABLET | Freq: Once | ORAL | Status: DC
  Administered 2012-11-05: 2 via ORAL
  Filled 2012-11-05: qty 2

## 2012-11-05 MED ORDER — IOHEXOL 300 MG/ML  SOLN
100.0000 mL | Freq: Once | INTRAMUSCULAR | Status: AC | PRN
  Administered 2012-11-05: 100 mL via INTRAVENOUS

## 2012-11-05 MED ORDER — MORPHINE SULFATE 4 MG/ML IJ SOLN: 4.0000 mg | Freq: Once | INTRAMUSCULAR | Status: DC

## 2012-11-05 MED ORDER — HYDROCODONE-ACETAMINOPHEN 5-325 MG PO TABS: 1.0000 | ORAL_TABLET | ORAL | Status: DC | PRN

## 2012-11-05 MED ORDER — ONDANSETRON HCL 4 MG/2ML IJ SOLN: 4.0000 mg | Freq: Once | INTRAMUSCULAR | Status: DC

## 2012-11-05 NOTE — ED Provider Notes (Signed)
Medical screening examination/treatment/procedure(s) were performed by non-physician practitioner and as supervising physician I was immediately available for consultation/collaboration.  Wil Slape F Krystalynn Ridgeway, MD 11/05/12 0032 
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
## 2012-11-05 NOTE — ED Provider Notes (Signed)
Medical screening examination/treatment/procedure(s) were performed by non-physician practitioner and as supervising physician I was immediately available for consultation/collaboration.   Glynn Octave, MD 11/05/12 1452
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
## 2014-03-19 ENCOUNTER — Emergency Department (HOSPITAL_COMMUNITY)
Admission: EM | Admit: 2014-03-19 | Discharge: 2014-03-19 | Disposition: A | Discharge status: Home / Self Care | Payer: BLUE CROSS/BLUE SHIELD

## 2014-03-19 NOTE — ED Notes (Signed)
Pt called for triage but no answer from the lobby. First call

## 2014-03-19 NOTE — ED Notes (Addendum)
Pt called to the triage area but not response from the lobby.  Second Call

## 2014-03-19 NOTE — ED Notes (Signed)
Pt called for a third time with out a response.

## 2014-08-07 ENCOUNTER — Encounter (HOSPITAL_COMMUNITY): Payer: Self-pay | Admitting: Emergency Medicine

## 2014-08-07 ENCOUNTER — Emergency Department (HOSPITAL_COMMUNITY)
Admission: EM | Admit: 2014-08-07 | Discharge: 2014-08-07 | Disposition: A | Discharge status: Home / Self Care | Payer: BLUE CROSS/BLUE SHIELD | Source: Home / Self Care

## 2014-08-07 NOTE — ED Notes (Signed)
Dr Artis Flockkindl ready to see patient, patient did not return to treatment room

## 2014-08-07 NOTE — ED Notes (Signed)
Patient left treatment room to take children to the lobby

## 2014-08-07 NOTE — ED Notes (Signed)
Patient did not return to treatment room.

## 2014-08-07 NOTE — ED Notes (Signed)
Multiple complaints C/o right ankle swelling for 3 years.  Says work wants a note Reports rectal bleeding, hard stool, bright red blood .  Patient requesting a laxative
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
## 2018-06-18 ENCOUNTER — Ambulatory Visit (HOSPITAL_COMMUNITY)
Admission: EM | Admit: 2018-06-18 | Discharge: 2018-06-18 | Disposition: A | Discharge status: Home / Self Care | Payer: BLUE CROSS/BLUE SHIELD | Attending: Internal Medicine | Admitting: Internal Medicine

## 2018-06-18 ENCOUNTER — Other Ambulatory Visit: Payer: Self-pay

## 2018-06-18 DIAGNOSIS — S76311A Strain of muscle, fascia and tendon of the posterior muscle group at thigh level, right thigh, initial encounter: Secondary | ICD-10-CM | POA: Diagnosis not present

## 2018-06-18 MED ORDER — NAPROXEN 500 MG PO TABS: 500.0000 mg | ORAL_TABLET | Freq: Two times a day (BID) | ORAL | 0 refills | Status: DC | PRN

## 2018-06-18 NOTE — ED Provider Notes (Signed)
[facility]    CSN: [id number] Arrival date & time: 06/18/18  1613     History   Chief Complaint Chief Complaint  Patient presents with  . Leg Pain    HPI Jason Johnston is a 33 y.o. male presenting for acute concern of right hamstring pain.  Patient states that he helped his grandmother move a cabinet 2 weeks ago.  Denies pop/snap/tear sensation.  Patient states pain is worse with bending over, alleviated with massage and heat, though has not tried any medications for this.  Patient states pain is nonradiating: Denies hip or knee pain, ipsilateral joint pain, numbness, tingling, weakness.  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   Past Medical History:  Diagnosis Date  . Asthma     There are no active problems to display for this patient.   No past surgical history on file.     Home Medications    Prior to Admission medications   Medication Sig Start Date End Date Taking? Authorizing Provider  albuterol (PROVENTIL HFA;VENTOLIN HFA) 108 (90 BASE) MCG/ACT inhaler Inhale 1-2 puffs into the lungs every 6 (six) hours as needed for wheezing. 05/27/12   Moreno-Coll, Adlih, MD  HYDROcodone-acetaminophen (NORCO/VICODIN) 5-325 MG per tablet Take 1-2 tablets by mouth every 4 (four) hours as needed for pain. 11/05/12   Trixie DredgeWest, Emily, PA-C  ibuprofen (ADVIL,MOTRIN) 200 MG tablet Take 400 mg by mouth every 6 (six) hours as needed for pain.    [provider]  naproxen (NAPROSYN) 500 MG tablet Take 1 tablet (500 mg total) by mouth 2 (two) times daily as needed for moderate pain. 06/18/18   Hall-Potvin, [place], PA-C    Family History No family history on file.  Social History Social History   Tobacco Use  . Smoking status: Current Every Day Smoker    Packs/day: 0.50    Types: Cigarettes  Substance Use Topics  . Alcohol use: Yes    Alcohol/week: 1.0 standard drinks    Types: 1 Glasses of  wine per week  . Drug use: Yes    Types: Marijuana     Allergies   Patient has no known allergies.   Review of Systems Review of Systems as per HPI   Physical Exam Triage Vital Signs ED Triage Vitals  Enc Vitals Group     BP 06/18/18 1634 121/77     Pulse Rate 06/18/18 1634 89     Resp 06/18/18 1634 18     Temp 06/18/18 1634 98.9 F (37.2 C)     Temp Source 06/18/18 1634 Oral     SpO2 06/18/18 1634 100 %     Weight 06/18/18 1636 255 lb (115.7 kg)     Height --      Head Circumference --      Peak Flow --      Pain Score 06/18/18 1632 6     Pain Loc --      Pain Edu? --      Excl. in [place]? --    No data found.  Updated Vital Signs BP 121/77 (BP Location: Right Arm)   Pulse 89   Temp 98.9 F (37.2 C) (Oral)   Resp 18   Wt 255 lb (115.7 kg)   SpO2 100%   BMI 33.64 kg/m   Visual Acuity Right Eye Distance:   Left Eye Distance:   Bilateral Distance:    Right Eye Near:   Left  Eye Near:    Bilateral Near:     Physical Exam Constitutional:      General: He is not in acute distress.    Appearance: He is obese.  HENT:     Head: Normocephalic and atraumatic.  Cardiovascular:     Rate and Rhythm: Normal rate.  Pulmonary:     Effort: Pulmonary effort is normal. No respiratory distress.  Musculoskeletal:     Comments: Full active range of motion of hip and knee joints.  Patient endorses muscle tightness, discomfort when bending at the hip denies radiation of pain.  Right hamstring pain reproducible with palpation.  Neurological:     Mental Status: He is alert.      UC Treatments / Results  Labs (all labs ordered are listed, but only abnormal results are displayed) Labs Reviewed - No data to display  EKG None  Radiology No results found.  Procedures Procedures (including critical care time)  Medications Ordered in UC Medications - No data to display  Initial Impression / Assessment and Plan / UC Course  I have reviewed the triage vital signs  and the nursing notes.  Pertinent labs & imaging results that were available during my care of the patient were reviewed by me and considered in my medical decision making (see chart for details).     33 year old male presenting for hamstring pain.  Consistent with muscle strain, will treat with anti-inflammatory, heat, lifting restrictions.  Return precautions discussed, patient verbalized understanding. Final Clinical Impressions(s) / UC Diagnoses   Final diagnoses:  Hamstring strain, right, initial encounter     Discharge Instructions     Use heating pads for additional relief and help relax muscles.  Take naproxen as needed. Do stretches as discussed today.    ED Prescriptions    Medication Sig Dispense Auth. Provider   naproxen (NAPROSYN) 500 MG tablet Take 1 tablet (500 mg total) by mouth 2 (two) times daily as needed for moderate pain. 30 tablet Hall-Potvin, [place], PA-C     Controlled Substance Prescriptions [place] Controlled Substance Registry consulted? Not Applicable   Shea Evans, [place] 06/18/18 2040

## 2018-06-18 NOTE — ED Triage Notes (Signed)
Pt states she has leg pain x 3 weeks. Pt was helping moving a [place] cabinet. Pt needs work note.

## 2018-06-18 NOTE — Discharge Instructions (Addendum)
Use heating pads for additional relief and help relax muscles.  Take naproxen as needed. Do stretches as discussed today.

## 2019-07-06 ENCOUNTER — Other Ambulatory Visit: Payer: Self-pay

## 2019-07-06 ENCOUNTER — Encounter (HOSPITAL_COMMUNITY): Payer: Self-pay

## 2019-07-06 ENCOUNTER — Ambulatory Visit (HOSPITAL_COMMUNITY)
Admission: EM | Admit: 2019-07-06 | Discharge: 2019-07-06 | Disposition: A | Discharge status: Home / Self Care | Payer: 59 | Attending: Physician Assistant | Admitting: Physician Assistant

## 2019-07-06 DIAGNOSIS — J45909 Unspecified asthma, uncomplicated: Secondary | ICD-10-CM | POA: Diagnosis not present

## 2019-07-06 DIAGNOSIS — Z20822 Contact with and (suspected) exposure to covid-19: Secondary | ICD-10-CM | POA: Insufficient documentation

## 2019-07-06 DIAGNOSIS — F17210 Nicotine dependence, cigarettes, uncomplicated: Secondary | ICD-10-CM | POA: Diagnosis not present

## 2019-07-06 DIAGNOSIS — A084 Viral intestinal infection, unspecified: Secondary | ICD-10-CM | POA: Diagnosis not present

## 2019-07-06 DIAGNOSIS — R1030 Lower abdominal pain, unspecified: Secondary | ICD-10-CM | POA: Diagnosis present

## 2019-07-06 NOTE — ED Triage Notes (Signed)
Pt states he has recovered from a stomach virus and needs a note to return to work

## 2019-07-06 NOTE — Discharge Instructions (Signed)
It appears your are improving, continue to hydrate well with water and pedialyte  Establish with the family medicine center for primary care  If you notice any further blood in or on stool, schedule with the GI group  If symptoms return, please return to this clinic for evaluation  If your Covid-19 test is positive, you will receive a phone call from [facility] regarding your results. Negative test results are not called. Both positive and negative results area always visible on MyChart. If you do not have a MyChart account, sign up instructions are in your discharge papers.   Persons who are directed to care for themselves at home may discontinue isolation under the following conditions:   At least 10 days have passed since symptom onset and  At least 24 hours have passed without running a fever (this means without the use of fever-reducing medications) and  Other symptoms have improved.  Persons infected with COVID-19 who never develop symptoms may discontinue isolation and other precautions 10 days after the date of their first positive COVID-19 test.

## 2019-07-06 NOTE — ED Provider Notes (Signed)
[facility]    CSN: [id number] Arrival date & time: 07/06/19  1646      History   Chief Complaint Chief Complaint  Patient presents with  . Letter for School/Work    HPI Jason Johnston is a 34 y.o. male.   Patient presents for a return to work note.  He reports he has had a recent stomach bug.  He reports Saturday evening around midnight he was driving home became sweaty and started to have stomach cramping.  This was followed by episodes of vomiting.  Reports he had several episodes of vomiting over the weekend.  He reports diarrhea as well.  He reports no blood in the vomit however did believe he saw some blood initially in the episodes of diarrhea but this has since stopped.  He believes the blood was on top of the stool.  No pain with stool.  He reports since then his vomiting is stopped and his stool is becoming formed again.  He has mild abdominal cramping in the lower abdomen.  He has not had a fever or chills.  He is tolerating liquids well.  He has not had upper respiratory symptoms of cough, congestion, sore throat or headache.  He has not been around by sick.  He does report some darker colored stools but he is also taken Pepto throughout the duration of his symptoms.  He reports he feels significantly better today.  He does not have a primary care.     Past Medical History:  Diagnosis Date  . Asthma     There are no problems to display for this patient.   History reviewed. No pertinent surgical history.     Home Medications    Prior to Admission medications   Not on File    Family History History reviewed. No pertinent family history.  Social History Social History   Tobacco Use  . Smoking status: Current Every Day Smoker    Packs/day: 0.50    Types: Cigarettes  Substance Use Topics  . Alcohol use: Yes    Alcohol/week: 1.0 standard drink    Types: 1 Glasses of wine per week  . Drug use: Yes    Types: Marijuana     Allergies     Patient has no known allergies.   Review of Systems Review of Systems   Physical Exam Triage Vital Signs ED Triage Vitals [07/06/19 1716]  Enc Vitals Group     BP (!) 135/105     Pulse Rate 94     Resp 16     Temp 98 F (36.7 C)     Temp src      SpO2 99 %     Weight      Height      Head Circumference      Peak Flow      Pain Score 0     Pain Loc      Pain Edu?      Excl. in [place]?    No data found.  Updated Vital Signs BP (!) 135/105   Pulse 94   Temp 98 F (36.7 C)   Resp 16   SpO2 99%   Visual Acuity Right Eye Distance:   Left Eye Distance:   Bilateral Distance:    Right Eye Near:   Left Eye Near:    Bilateral Near:     Physical Exam Vitals and nursing note reviewed.  Constitutional:      General: He is  not in acute distress.    Appearance: He is well-developed. He is not ill-appearing.  HENT:     Head: Normocephalic and atraumatic.  Eyes:     Conjunctiva/sclera: Conjunctivae normal.  Cardiovascular:     Rate and Rhythm: Normal rate and regular rhythm.     Heart sounds: No murmur heard.   Pulmonary:     Effort: Pulmonary effort is normal. No respiratory distress.     Breath sounds: Normal breath sounds.  Abdominal:     Palpations: Abdomen is soft.     Tenderness: There is no abdominal tenderness. There is no right CVA tenderness or left CVA tenderness.  Musculoskeletal:     Cervical back: Neck supple.     Right lower leg: No edema.     Left lower leg: No edema.  Skin:    General: Skin is warm and dry.  Neurological:     Mental Status: He is alert.      UC Treatments / Results  Labs (all labs ordered are listed, but only abnormal results are displayed) Labs Reviewed  SARS CORONAVIRUS 2 (TAT 6-24 HRS)    EKG   Radiology No results found.  Procedures Procedures (including critical care time)  Medications Ordered in UC Medications - No data to display  Initial Impression / Assessment and Plan / UC Course  I have reviewed  the triage vital signs and the nursing notes.  Pertinent labs & imaging results that were available during my care of the patient were reviewed by me and considered in my medical decision making (see chart for details).    #Viral gastroenteritis Patient is a 34 year old who presents with what is most likely a recent viral gastroenteritis that he is mostly recovered from.  Does report some possible blood early in illness however this is resolved.  Believe dark stools may be related to Pepto.  Discussed monitoring for any return of blood and to stop Pepto for evaluation of stools returning to normal.  He has a reassuring exam today and is afebrile.  Does not appear clinically dehydrated and is tolerating orals well.  Discussed testing for Covid out of an abundance precaution and patient agrees.  Recommended continued p.o. hydration with return and follow-up precautions.  Family medicine group to establish care.  GI information given if any continued blood in stool.  Patient verbalized understanding plan Final Clinical Impressions(s) / UC Diagnoses   Final diagnoses:  Viral gastroenteritis     Discharge Instructions     It appears your are improving, continue to hydrate well with water and pedialyte  Establish with the family medicine center for primary care  If you notice any further blood in or on stool, schedule with the GI group  If symptoms return, please return to this clinic for evaluation  If your Covid-19 test is positive, you will receive a phone call from [facility] regarding your results. Negative test results are not called. Both positive and negative results area always visible on MyChart. If you do not have a MyChart account, sign up instructions are in your discharge papers.   Persons who are directed to care for themselves at home may discontinue isolation under the following conditions:  . At least 10 days have passed since symptom onset and . At least 24 hours have  passed without running a fever (this means without the use of fever-reducing medications) and . Other symptoms have improved.  Persons infected with COVID-19 who never develop symptoms may discontinue isolation and  other precautions 10 days after the date of their first positive COVID-19 test.      ED Prescriptions    None     PDMP not reviewed this encounter.   Hermelinda Medicus, PA-C 07/06/19 1755

## 2019-07-07 LAB — SARS CORONAVIRUS 2 (TAT 6-24 HRS): SARS Coronavirus 2: NEGATIVE

## 2019-08-19 ENCOUNTER — Ambulatory Visit (HOSPITAL_COMMUNITY): Admission: EM | Admit: 2019-08-19 | Discharge: 2019-08-19 | Discharge status: Against Medical Advice | Payer: 59

## 2019-08-19 ENCOUNTER — Other Ambulatory Visit: Payer: Self-pay

## 2019-09-18 ENCOUNTER — Encounter (HOSPITAL_COMMUNITY): Payer: Self-pay

## 2019-09-18 ENCOUNTER — Ambulatory Visit (HOSPITAL_COMMUNITY)
Admission: EM | Admit: 2019-09-18 | Discharge: 2019-09-18 | Disposition: A | Discharge status: Home / Self Care | Payer: 59 | Attending: Physician Assistant | Admitting: Physician Assistant

## 2019-09-18 ENCOUNTER — Other Ambulatory Visit: Payer: Self-pay

## 2019-09-18 DIAGNOSIS — L0231 Cutaneous abscess of buttock: Secondary | ICD-10-CM | POA: Diagnosis not present

## 2019-09-18 MED ORDER — ACETAMINOPHEN 500 MG PO TABS: 1000.0000 mg | ORAL_TABLET | Freq: Three times a day (TID) | ORAL | 0 refills | Status: DC | PRN

## 2019-09-18 MED ORDER — AMOXICILLIN-POT CLAVULANATE 875-125 MG PO TABS: 1.0000 | ORAL_TABLET | Freq: Two times a day (BID) | ORAL | 0 refills | Status: AC

## 2019-09-18 MED ORDER — LIDOCAINE-EPINEPHRINE 1 %-1:100000 IJ SOLN
INTRAMUSCULAR | Status: AC
  Filled 2019-09-18: qty 1

## 2019-09-18 MED ORDER — DOXYCYCLINE HYCLATE 100 MG PO CAPS: 100.0000 mg | ORAL_CAPSULE | Freq: Two times a day (BID) | ORAL | 0 refills | Status: DC

## 2019-09-18 MED ORDER — TRAMADOL HCL 50 MG PO TABS: 50.0000 mg | ORAL_TABLET | Freq: Two times a day (BID) | ORAL | 0 refills | Status: AC | PRN

## 2019-09-18 NOTE — ED Triage Notes (Signed)
Pt c/o abscess on right buttocksx3 yrs. Pt states it has grown bigger over the past wk. Pt states he drained it at home and it got smaller. Pt states it's painful.

## 2019-09-18 NOTE — Discharge Instructions (Signed)
Take the medications as prescribed -Tramadol will make you sleepy, do not drive drink alcohol or operate machinery within 8 hours of taking -Take antibiotics with food -Take Tylenol every 8 hours  Change bandages daily, warm soapy water soaks daily  Call [facility] for follow-up Monday if possible  If you are unable to be seen Monday by [facility] surgery, return to this urgent care for packing removal and wound check

## 2019-09-18 NOTE — ED Provider Notes (Signed)
[facility]    CSN: [id number] Arrival date & time: 09/18/19  1123      History   Chief Complaint Chief Complaint  Patient presents with  . Abscess    HPI Jason Johnston is a 34 y.o. male.   Patient reports for abscess of right buttocks.  He reports he has had abscesses here on and off over the last 3 years.  He reports most recent has been present for about a week.  He has attempted to drain this on his own.  He reports he did get some discharge however it is continued to stay swollen and quite painful.  He denies any fevers or chills.  States he does not believe this is near his rectum.  Has not had any discharge in his stool or rectum.     Past Medical History:  Diagnosis Date  . Asthma     There are no problems to display for this patient.   History reviewed. No pertinent surgical history.     Home Medications    Prior to Admission medications   Medication Sig Start Date End Date Taking? Authorizing Provider  acetaminophen (TYLENOL) 500 MG tablet Take 2 tablets (1,000 mg total) by mouth every 8 (eight) hours as needed. 09/18/19   Jason Johnston, Veryl Speak, PA-C  amoxicillin-clavulanate (AUGMENTIN) 875-125 MG tablet Take 1 tablet by mouth 2 (two) times daily for 10 days. 09/18/19 09/28/19  Jason Johnston, Veryl Speak, PA-C  doxycycline (VIBRAMYCIN) 100 MG capsule Take 1 capsule (100 mg total) by mouth 2 (two) times daily. 09/18/19   Jason Johnston, Veryl Speak, PA-C  traMADol (ULTRAM) 50 MG tablet Take 1 tablet (50 mg total) by mouth every 12 (twelve) hours as needed for up to 3 days. 09/18/19 09/21/19  Jason Johnston, Veryl Speak, PA-C    Family History No family history on file.  Social History Social History   Tobacco Use  . Smoking status: Current Every Day Smoker    Packs/day: 0.50    Types: Cigarettes  Substance Use Topics  . Alcohol use: Yes    Alcohol/week: 1.0 standard drink    Types: 1 Glasses of wine per week  . Drug use: Yes    Types: Marijuana     Allergies   Patient has no known  allergies.   Review of Systems Review of Systems   Physical Exam Triage Vital Signs ED Triage Vitals  Enc Vitals Group     BP 09/18/19 1158 126/77     Pulse Rate 09/18/19 1158 80     Resp 09/18/19 1158 16     Temp 09/18/19 1158 98.2 F (36.8 C)     Temp Source 09/18/19 1158 Oral     SpO2 09/18/19 1158 99 %     Weight 09/18/19 1201 252 lb 6.4 oz (114.5 kg)     Height 09/18/19 1201 6\' 1"  (1.854 m)     Head Circumference --      Peak Flow --      Pain Score 09/18/19 1201 8     Pain Loc --      Pain Edu? --      Excl. in GC? --    No data found.  Updated Vital Signs BP 126/77   Pulse 80   Temp 98.2 F (36.8 C) (Oral)   Resp 16   Ht 6\' 1"  (1.854 m)   Wt 252 lb 6.4 oz (114.5 kg)   SpO2 99%   BMI 33.30 kg/m   Visual Acuity Right  Eye Distance:   Left Eye Distance:   Bilateral Distance:    Right Eye Near:   Left Eye Near:    Bilateral Near:     Physical Exam Skin:         Comments: Approximately 5 cm in diameter area of induration with central fluctuance on the right buttocks.  Does not extend to the perirectal area.  No active drainage.      UC Treatments / Results  Labs (all labs ordered are listed, but only abnormal results are displayed) Labs Reviewed - No data to display  EKG   Radiology No results found.  Procedures Incision and Drainage  Date/Time: 09/18/2019 9:43 PM Performed by: Hermelinda Medicus, PA-C Authorized by: Hermelinda Medicus, PA-C   Consent:    Consent obtained:  Verbal   Consent given by:  Patient   Risks discussed:  Bleeding, incomplete drainage, pain and damage to other organs   Alternatives discussed:  No treatment Universal protocol:    Procedure explained and questions answered to patient or proxy's satisfaction: yes     Relevant documents present and verified: yes     Test results available and properly labeled: yes     Imaging studies available: yes     Required blood products, implants, devices, and special equipment  available: yes     Site/side marked: yes     Immediately prior to procedure a time out was called: yes     Patient identity confirmed:  Verbally with patient Location:    Type:  Abscess   Size:  5   Location:  Lower extremity   Lower extremity location:  Buttock   Buttock location:  R buttock Pre-procedure details:    Skin preparation:  Betadine and antiseptic wash Anesthesia (see MAR for exact dosages):    Anesthesia method:  Local infiltration   Local anesthetic:  Lidocaine 1% WITH epi Procedure type:    Complexity:  Complex Procedure details:    Incision types:  Single straight   Incision depth:  Subcutaneous   Scalpel blade:  11   Wound management:  Probed and deloculated   Drainage:  Purulent and bloody   Drainage amount:  Moderate   Packing materials:  1/4 in gauze   Amount 1/4":  5-6in Post-procedure details:    Patient tolerance of procedure:  Tolerated well, no immediate complications Comments:     Abscess appreciated to be fairly deep ongoing probing and deloculation.  Does not appear to communicate further with the perirectal area, however some concern for deeper tracking.   (including critical care time)  Medications Ordered in UC Medications - No data to display  Initial Impression / Assessment and Plan / UC Course  I have reviewed the triage vital signs and the nursing notes.  Pertinent labs & imaging results that were available during my care of the patient were reviewed by me and considered in my medical decision making (see chart for details).     #Abscess of right buttocks Patient is a 34 year old presenting with abscess right buttocks.  Successful incision and drainage here, some concern for depth of pocket with possible tracking.  Quite foul odor from discharge.  Will place on Augmentin and doxycycline.  Recommend patient follow-up with surgical service for further evaluation due to possible tracking.  Encourage patient to call Monday morning, however  did state that he needs to return Monday for packing removal either at this clinic or with the surgery service.  Discussed other return and  emergency department cautions.  Patient verbalized understanding plan of care. Final Clinical Impressions(s) / UC Diagnoses   Final diagnoses:  Abscess of buttock, right     Discharge Instructions     Take the medications as prescribed -Tramadol will make you sleepy, do not drive drink alcohol or operate machinery within 8 hours of taking -Take antibiotics with food -Take Tylenol every 8 hours  Change bandages daily, warm soapy water soaks daily  Call [facility] [place] surgery for follow-up Monday if possible  If you are unable to be seen Monday by [facility] surgery, return to this urgent care for packing removal and wound check     ED Prescriptions    Medication Sig Dispense Auth. Provider   doxycycline (VIBRAMYCIN) 100 MG capsule Take 1 capsule (100 mg total) by mouth 2 (two) times daily. 20 capsule Cosmo Tetreault, Veryl Speak, PA-C   amoxicillin-clavulanate (AUGMENTIN) 875-125 MG tablet Take 1 tablet by mouth 2 (two) times daily for 10 days. 20 tablet Pheonix Clinkscale, Veryl Speak, PA-C   traMADol (ULTRAM) 50 MG tablet Take 1 tablet (50 mg total) by mouth every 12 (twelve) hours as needed for up to 3 days. 6 tablet Myracle Febres, Veryl Speak, PA-C   acetaminophen (TYLENOL) 500 MG tablet Take 2 tablets (1,000 mg total) by mouth every 8 (eight) hours as needed. 30 tablet Collette Pescador, Veryl Speak, PA-C     I have reviewed the PDMP during this encounter.   Hermelinda Medicus, PA-C 09/18/19 2146

## 2019-10-13 ENCOUNTER — Ambulatory Visit (HOSPITAL_COMMUNITY)
Admission: EM | Admit: 2019-10-13 | Discharge: 2019-10-13 | Disposition: A | Discharge status: Home / Self Care | Payer: 59 | Attending: Family Medicine | Admitting: Family Medicine

## 2019-10-13 ENCOUNTER — Encounter (HOSPITAL_COMMUNITY): Payer: Self-pay | Admitting: Emergency Medicine

## 2019-10-13 ENCOUNTER — Other Ambulatory Visit: Payer: Self-pay

## 2019-10-13 DIAGNOSIS — R52 Pain, unspecified: Secondary | ICD-10-CM

## 2019-10-13 DIAGNOSIS — R509 Fever, unspecified: Secondary | ICD-10-CM

## 2019-10-13 DIAGNOSIS — U071 COVID-19: Secondary | ICD-10-CM | POA: Insufficient documentation

## 2019-10-13 DIAGNOSIS — J45909 Unspecified asthma, uncomplicated: Secondary | ICD-10-CM | POA: Diagnosis not present

## 2019-10-13 DIAGNOSIS — F17210 Nicotine dependence, cigarettes, uncomplicated: Secondary | ICD-10-CM | POA: Insufficient documentation

## 2019-10-13 DIAGNOSIS — R439 Unspecified disturbances of smell and taste: Secondary | ICD-10-CM | POA: Diagnosis present

## 2019-10-13 NOTE — ED Provider Notes (Signed)
[facility]   [id number] 10/13/19 Arrival Time: 1520  ASSESSMENT & PLAN:  1. Subjective fever   2. Body aches   3. Decreased taste and smell      COVID-19 testing sent. See letter/work note on file for self-isolation guidelines. OTC symptom care as needed.   Follow-up Information    [facility].   Specialty: Urgent Care Why: As needed. Contact information: [address] [place] [address] [phone]              Reviewed expectations re: course of current medical issues. Questions answered. Outlined signs and symptoms indicating need for more acute intervention. Understanding verbalized. After Visit Summary given.   SUBJECTIVE: History from: patient. Jason Johnston is a 34 y.o. male who presents with worries regarding COVID-19. Known COVID-19 contact: none. Recent travel: none. Reports subj fever/chills, body aches, and decreased taste/smell over the past few days. Denies: difficulty breathing. Normal PO intake without n/v/d.    OBJECTIVE:  Vitals:   10/13/19 1757 10/13/19 1758  BP: (!) 119/97 117/70  Pulse: 81   Resp:  18  Temp: 98 F (36.7 C)   TempSrc: Oral   SpO2: 99%     General appearance: alert; no distress Eyes: PERRLA; EOMI; conjunctiva normal HENT: [place]; AT; with nasal congestion Neck: supple  Lungs: speaks full sentences without difficulty; unlabored Extremities: no edema Skin: warm and dry Neurologic: normal gait Psychological: alert and cooperative; normal mood and affect  Labs:  Labs Reviewed  SARS CORONAVIRUS 2 (TAT 6-24 HRS)      No Known Allergies  Past Medical History:  Diagnosis Date  . Asthma    Social History   Socioeconomic History  . Marital status: Married    Spouse name: Not on file  . Number of children: Not on file  . Years of education: Not on file  . Highest education level: Not on file  Occupational History  . Not on file  Tobacco Use  .  Smoking status: Current Every Day Smoker    Packs/day: 0.50    Types: Cigarettes  Substance and Sexual Activity  . Alcohol use: Yes    Alcohol/week: 1.0 standard drink    Types: 1 Glasses of wine per week  . Drug use: Yes    Types: Marijuana  . Sexual activity: Not on file  Other Topics Concern  . Not on file  Social History Narrative  . Not on file   Social Determinants of Health   Financial Resource Strain:   . Difficulty of Paying Living Expenses: Not on file  Food Insecurity:   . Worried About Programme researcher, broadcasting/film/video in the Last Year: Not on file  . Ran Out of Food in the Last Year: Not on file  Transportation Needs:   . Lack of Transportation (Medical): Not on file  . Lack of Transportation (Non-Medical): Not on file  Physical Activity:   . Days of Exercise per Week: Not on file  . Minutes of Exercise per Session: Not on file  Stress:   . Feeling of Stress : Not on file  Social Connections:   . Frequency of Communication with Friends and Family: Not on file  . Frequency of Social Gatherings with Friends and Family: Not on file  . Attends Religious Services: Not on file  . Active Member of Clubs or Organizations: Not on file  . Attends Banker Meetings: Not on file  . Marital Status: Not  on file  Intimate Partner Violence:   . Fear of Current or Ex-Partner: Not on file  . Emotionally Abused: Not on file  . Physically Abused: Not on file  . Sexually Abused: Not on file   Family History  Problem Relation Age of Onset  . Healthy Mother   . Healthy Father    History reviewed. No pertinent surgical history.   Mardella Layman, MD 10/13/19 [phone]

## 2019-10-13 NOTE — Discharge Instructions (Signed)
You have been tested for COVID-19 today. °If your test returns positive, you will receive a phone call from [facility] regarding your results. °Negative test results are not called. °Both positive and negative results area always visible on MyChart. °If you do not have a MyChart account, sign up instructions are provided in your discharge papers. °Please do not hesitate to contact us should you have questions or concerns. ° °

## 2019-10-13 NOTE — ED Triage Notes (Signed)
9/18 started with symptoms.  Patient has multiple family members that have similar symptoms, but no one has been tested.  Work is also requesting a test for covid  Fever initially, 103 on Saturday.  Has had cold chills and body aches, and lost taste and sense of smell

## 2019-10-14 LAB — SARS CORONAVIRUS 2 (TAT 6-24 HRS): SARS Coronavirus 2: POSITIVE — AB

## 2023-02-23 ENCOUNTER — Emergency Department (HOSPITAL_COMMUNITY): Payer: Medicaid Other

## 2023-02-23 ENCOUNTER — Other Ambulatory Visit: Payer: Self-pay

## 2023-02-23 ENCOUNTER — Inpatient Hospital Stay (HOSPITAL_COMMUNITY)
Admission: EM | Admit: 2023-02-23 | Discharge: 2023-02-25 | DRG: 322 | Disposition: A | Discharge status: Home / Self Care | Payer: Medicaid Other | Attending: Cardiology | Admitting: Cardiology

## 2023-02-23 ENCOUNTER — Encounter (HOSPITAL_COMMUNITY): Payer: Self-pay | Admitting: Emergency Medicine

## 2023-02-23 DIAGNOSIS — F17210 Nicotine dependence, cigarettes, uncomplicated: Secondary | ICD-10-CM | POA: Diagnosis present

## 2023-02-23 DIAGNOSIS — I10 Essential (primary) hypertension: Secondary | ICD-10-CM

## 2023-02-23 DIAGNOSIS — I493 Ventricular premature depolarization: Secondary | ICD-10-CM | POA: Diagnosis present

## 2023-02-23 DIAGNOSIS — R569 Unspecified convulsions: Secondary | ICD-10-CM | POA: Diagnosis present

## 2023-02-23 DIAGNOSIS — I2510 Atherosclerotic heart disease of native coronary artery without angina pectoris: Secondary | ICD-10-CM | POA: Diagnosis present

## 2023-02-23 DIAGNOSIS — I213 ST elevation (STEMI) myocardial infarction of unspecified site: Principal | ICD-10-CM

## 2023-02-23 DIAGNOSIS — I480 Paroxysmal atrial fibrillation: Secondary | ICD-10-CM

## 2023-02-23 DIAGNOSIS — I214 Non-ST elevation (NSTEMI) myocardial infarction: Secondary | ICD-10-CM

## 2023-02-23 DIAGNOSIS — R55 Syncope and collapse: Secondary | ICD-10-CM

## 2023-02-23 DIAGNOSIS — Z955 Presence of coronary angioplasty implant and graft: Secondary | ICD-10-CM

## 2023-02-23 DIAGNOSIS — I2121 ST elevation (STEMI) myocardial infarction involving left circumflex coronary artery: Principal | ICD-10-CM | POA: Diagnosis present

## 2023-02-23 DIAGNOSIS — E782 Mixed hyperlipidemia: Secondary | ICD-10-CM | POA: Diagnosis present

## 2023-02-23 DIAGNOSIS — Z79899 Other long term (current) drug therapy: Secondary | ICD-10-CM

## 2023-02-23 LAB — COMPREHENSIVE METABOLIC PANEL
ALT: 13 U/L (ref 0–44)
AST: 26 U/L (ref 15–41)
Albumin: 3 g/dL — ABNORMAL LOW (ref 3.5–5.0)
Alkaline Phosphatase: 62 U/L (ref 38–126)
Anion gap: 12 (ref 5–15)
BUN: 10 mg/dL (ref 6–20)
CO2: 21 mmol/L — ABNORMAL LOW (ref 22–32)
Calcium: 8.6 mg/dL — ABNORMAL LOW (ref 8.9–10.3)
Chloride: 104 mmol/L (ref 98–111)
Creatinine, Ser: 1.29 mg/dL — ABNORMAL HIGH (ref 0.61–1.24)
GFR, Estimated: >60 mL/min (ref >60)
Glucose, Bld: 146 mg/dL — ABNORMAL HIGH (ref 70–99)
Potassium: 4.6 mmol/L (ref 3.5–5.1)
Sodium: 137 mmol/L (ref 135–145)
Total Bilirubin: 1.2 mg/dL (ref 0.0–1.2)
Total Protein: 6.1 g/dL — ABNORMAL LOW (ref 6.5–8.1)

## 2023-02-23 LAB — I-STAT CHEM 8, ED
BUN: 8 mg/dL (ref 6–20)
Calcium, Ion: 1.03 mmol/L — ABNORMAL LOW (ref 1.15–1.40)
Chloride: 107 mmol/L (ref 98–111)
Creatinine, Ser: 1.1 mg/dL (ref 0.61–1.24)
Glucose, Bld: 106 mg/dL — ABNORMAL HIGH (ref 70–99)
HCT: 47 % (ref 39.0–52.0)
Hemoglobin: 16 g/dL (ref 13.0–17.0)
Potassium: 3.6 mmol/L (ref 3.5–5.1)
Sodium: 141 mmol/L (ref 135–145)
TCO2: 21 mmol/L — ABNORMAL LOW (ref 22–32)

## 2023-02-23 LAB — MAGNESIUM: Magnesium: 1.9 mg/dL (ref 1.7–2.4)

## 2023-02-23 LAB — CBC
HCT: 49.4 % (ref 39.0–52.0)
Hemoglobin: 16.3 g/dL (ref 13.0–17.0)
MCH: 30.5 pg (ref 26.0–34.0)
MCHC: 33 g/dL (ref 30.0–36.0)
MCV: 92.3 fL (ref 80.0–100.0)
Platelets: 409 10*3/uL — ABNORMAL HIGH (ref 150–400)
RBC: 5.35 MIL/uL (ref 4.22–5.81)
RDW: 12.6 % (ref 11.5–15.5)
WBC: 13.2 10*3/uL — ABNORMAL HIGH (ref 4.0–10.5)
nRBC: 0 % (ref 0.0–0.2)

## 2023-02-23 LAB — ACETAMINOPHEN LEVEL: Acetaminophen (Tylenol), Serum: <10 ug/mL — ABNORMAL LOW (ref 10–30)

## 2023-02-23 LAB — RESP PANEL BY RT-PCR (RSV, FLU A&B, COVID)  RVPGX2
Influenza A by PCR: NEGATIVE
Influenza B by PCR: NEGATIVE
Resp Syncytial Virus by PCR: NEGATIVE
SARS Coronavirus 2 by RT PCR: NEGATIVE

## 2023-02-23 LAB — SALICYLATE LEVEL: Salicylate Lvl: <7 mg/dL — ABNORMAL LOW (ref 7.0–30.0)

## 2023-02-23 LAB — ETHANOL: Alcohol, Ethyl (B): <10 mg/dL (ref <10)

## 2023-02-23 LAB — TROPONIN I (HIGH SENSITIVITY)
Troponin I (High Sensitivity): 19 ng/L — ABNORMAL HIGH (ref <18)
Troponin I (High Sensitivity): 163 ng/L (ref <18)

## 2023-02-23 MED ORDER — SODIUM CHLORIDE 0.9 % IV BOLUS
1000.0000 mL | Freq: Once | INTRAVENOUS | Status: AC
  Administered 2023-02-23: 1000 mL via INTRAVENOUS

## 2023-02-23 MED ORDER — HEPARIN SODIUM (PORCINE) 1000 UNIT/ML IJ SOLN
4000.0000 [IU] | Freq: Once | INTRAMUSCULAR | Status: AC
  Administered 2023-02-23: 4000 [IU] via INTRAVENOUS
  Filled 2023-02-23: qty 4

## 2023-02-23 MED ORDER — LEVETIRACETAM IN NACL 1500 MG/100ML IV SOLN
1500.0000 mg | Freq: Once | INTRAVENOUS | Status: DC
  Filled 2023-02-23: qty 100

## 2023-02-23 MED ORDER — ASPIRIN 81 MG PO CHEW
324.0000 mg | CHEWABLE_TABLET | Freq: Once | ORAL | Status: AC
  Administered 2023-02-23: 324 mg via ORAL

## 2023-02-23 NOTE — ED Provider Notes (Cosign Needed Addendum)
[place] EMERGENCY DEPARTMENT AT [facility] Provider Note   CSN: [id number] Arrival date & time: 02/23/23  2017     History  Chief Complaint  Patient presents with   Altered Mental Status    Pt BIB EMS from home for witnessed seizure. Per EMS, pt used ETOH yesterday, then donated blood and had a witnessed seizure at home today. Pt does not have a known hx of seizures and per EMS is not a daily drinker.     Orien Mayhall Billet is a 38 y.o. male past medical history significant for asthma who is brought in by EMS from home for witnessed seizure.  Per EMS patient with alcohol use yesterday, donated blood yesterday, witnessed seizure at home today.  No known history of seizures, not a daily drinker.  He denies any other alcohol, drug use.  He denies any recent head injury.  He denies any chest pain, shortness of breath.  He denies vomiting, loss control bladder, bowels.  His fiance gave some clarifying history, reports that he drank heavily last night, donated plasma this morning.  She reports that he was having some abdominal pain, being on the ground, single event versus other, and felt like he had clenched his arms.  She suspected possible seizure.  She was asking about chest pain but he would not clarify or report to her.  Altered Mental Status      Home Medications Prior to Admission medications   Medication Sig Start Date End Date Taking? Authorizing Provider  acetaminophen (TYLENOL) 500 MG tablet Take 2 tablets (1,000 mg total) by mouth every 8 (eight) hours as needed. 09/18/19   Darr, Gerilyn Pilgrim, PA-C      Allergies    Patient has no known allergies.    Review of Systems   Review of Systems  All other systems reviewed and are negative.   Physical Exam Updated Vital Signs BP (!) 129/96   Pulse 87   Temp 98.4 F (36.9 C) (Rectal)   Resp 19   Ht 6\' 2"  (1.88 m)   Wt 120.2 kg   SpO2 93%   BMI 34.02 kg/m  Physical Exam Vitals and nursing note reviewed.   Constitutional:      General: He is not in acute distress.    Appearance: Normal appearance.  HENT:     Head: Normocephalic and atraumatic.  Eyes:     General:        Right eye: No discharge.        Left eye: No discharge.  Cardiovascular:     Rate and Rhythm: Normal rate and regular rhythm.     Heart sounds: No murmur heard.    No friction rub. No gallop.  Pulmonary:     Effort: Pulmonary effort is normal.     Breath sounds: Normal breath sounds.     Comments: Coarse rhonchi throughout lung fields Abdominal:     General: Bowel sounds are normal.     Palpations: Abdomen is soft.  Skin:    General: Skin is warm and dry.     Capillary Refill: Capillary refill takes less than 2 seconds.  Neurological:     Mental Status: He is alert and oriented to person, place, and time.  Psychiatric:        Mood and Affect: Mood normal.        Behavior: Behavior normal.     ED Results / Procedures / Treatments   Labs (all labs ordered are listed, but only  abnormal results are displayed) Labs Reviewed  CBC - Abnormal; Notable for the following components:      Result Value   WBC 13.2 (*)    Platelets 409 (*)    All other components within normal limits  COMPREHENSIVE METABOLIC PANEL - Abnormal; Notable for the following components:   CO2 21 (*)    Glucose, Bld 146 (*)    Creatinine, Ser 1.29 (*)    Calcium 8.6 (*)    Total Protein 6.1 (*)    Albumin 3.0 (*)    All other components within normal limits  ACETAMINOPHEN LEVEL - Abnormal; Notable for the following components:   Acetaminophen (Tylenol), Serum <10 (*)    All other components within normal limits  SALICYLATE LEVEL - Abnormal; Notable for the following components:   Salicylate Lvl <7.0 (*)    All other components within normal limits  I-STAT CHEM 8, ED - Abnormal; Notable for the following components:   Glucose, Bld 106 (*)    Calcium, Ion 1.03 (*)    TCO2 21 (*)    All other components within normal limits  TROPONIN  I (HIGH SENSITIVITY) - Abnormal; Notable for the following components:   Troponin I (High Sensitivity) 19 (*)    All other components within normal limits  TROPONIN I (HIGH SENSITIVITY) - Abnormal; Notable for the following components:   Troponin I (High Sensitivity) 163 (*)    All other components within normal limits  RESP PANEL BY RT-PCR (RSV, FLU A&B, COVID)  RVPGX2  MAGNESIUM  ETHANOL  URINALYSIS, ROUTINE W REFLEX MICROSCOPIC  RAPID URINE DRUG SCREEN, HOSP PERFORMED    EKG EKG Interpretation Date/Time:  Sunday February 23 2023 22:49:57 EST Ventricular Rate:  98 PR Interval:    QRS Duration:  87 QT Interval:  360 QTC Calculation: 460 R Axis:   49  Text Interpretation: new  Atrial fibrillation Minimal ST elevation, inferior leads Confirmed by Gwyneth Sprout ([id number]) on 02/23/2023 10:58:42 PM  Radiology DG Chest Portable 1 View Result Date: 02/23/2023 CLINICAL DATA:  Shortness of breath EXAM: PORTABLE CHEST 1 VIEW COMPARISON:  11/04/2012 FINDINGS: No consolidation or effusion. Diffuse bilateral somewhat reticular interstitial opacity. Normal cardiac size. No pneumothorax IMPRESSION: Diffuse bilateral somewhat reticular interstitial opacity, findings suspicious for infection, possible atypical pneumonia. Electronically Signed   By: Jasmine Pang M.D.   On: 02/23/2023 21:02   CT Head Wo Contrast Result Date: 02/23/2023 CLINICAL DATA:  Seizure, new-onset, no history of trauma a EXAM: CT HEAD WITHOUT CONTRAST TECHNIQUE: Contiguous axial images were obtained from the base of the skull through the vertex without intravenous contrast. RADIATION DOSE REDUCTION: This exam was performed according to the departmental dose-optimization program which includes automated exposure control, adjustment of the mA and/or kV according to patient size and/or use of iterative reconstruction technique. COMPARISON:  None Available. FINDINGS: Brain: No evidence of acute infarction, hemorrhage, hydrocephalus,  extra-axial collection or mass lesion/mass effect. Vascular: No hyperdense vessel identified. Skull: No acute fracture. Sinuses/Orbits: Moderate left maxillary sinus mucosal thickening. No acute orbital findings. Other: No mastoid effusions. IMPRESSION: No evidence of acute intracranial abnormality. Electronically Signed   By: Feliberto Harts M.D.   On: 02/23/2023 20:57    Procedures .Critical Care  Performed by: Olene Floss, PA-C Authorized by: Olene Floss, PA-C   Critical care provider statement:    Critical care time (minutes):  35   Critical care was necessary to treat or prevent imminent or life-threatening deterioration of the following conditions:  Cardiac failure (STEMI)   Critical care was time spent personally by me on the following activities:  Development of treatment plan with patient or surrogate, discussions with consultants, evaluation of patient's response to treatment, examination of patient, ordering and review of laboratory studies, ordering and review of radiographic studies, ordering and performing treatments and interventions, pulse oximetry, re-evaluation of patient's condition and review of old charts   Care discussed with: admitting provider       Medications Ordered in ED Medications  sodium chloride 0.9 % bolus 1,000 mL (0 mLs Intravenous Stopped 02/23/23 2243)  aspirin chewable tablet 324 mg (324 mg Oral Given 02/23/23 2347)  heparin sodium (porcine) injection 4,000 Units (4,000 Units Intravenous Given 02/23/23 2351)    ED Course/ Medical Decision Making/ A&P                                 Medical Decision Making Amount and/or Complexity of Data Reviewed Labs: ordered. Radiology: ordered.  Risk OTC drugs. Prescription drug management.   This patient is a 38 y.o. male who presents to the ED for concern of syncope, chest pain, this involves an extensive number of treatment options, and is a complaint that carries with it a high risk of  complications and morbidity. The emergent differential diagnosis prior to evaluation includes, but is not limited to,  CVA, ACS, arrhythmia, vasovagal syncope, orthostatic hypotension, sepsis, hypoglycemia, electrolyte disturbance, respiratory failure, symptomatic anemia, dehydration, heat injury, polypharmacy, malignancy, anxiety/panic attack, ACS, AAS, PE, Mallory-Weiss, Boerhaave's, Pneumonia, acute bronchitis, asthma or COPD exacerbation, anxiety, MSK pain or traumatic injury to the chest, acid reflux versus other . This is not an exhaustive differential.   Past Medical History / Co-morbidities / Social History: Non contributory past medical history.  His partner reports that he was drinking heavily yesterday and then went to donate plasma today.  Physical Exam: Physical exam performed. The pertinent findings include: Somewhat tired, snoring, but easily arousable, denies any pain.  He is mildly hypertensive on arrival, blood pressure 135/96, moving all 4 limbs spontaneously.  He is able to tell me his name, the date, response to questions appropriately when aroused.  Initially mildly tachypneic, respiratory rate 22.  Lab Tests: I ordered, and personally interpreted labs.  The pertinent results include: CBC is notable for mild leukocytosis, white blood cell 13.2, platelets 409, suspect hemoconcentration, hemoglobin also high of upper limit of normal, likely secondary to his heavy drinking yesterday and then plasma donation today.  CMP notable for mild bicarb to stay, CO2 21, his creatinine is mildly elevated at 1.29, but no recent baseline on file.  Normal ethanol level, salicylate level, normal magnesium.  His initial troponin is very mildly elevated 19, his EKG is concerning for ST elevations in inferior leads with reciprocal changes noted in aVR, aVL, anterior leads.  Repeat troponin is elevated at 163.     Imaging Studies: I ordered imaging studies including plain film chest x-ray, CT head without  contrast. I independently visualized and interpreted imaging which showed no evidence of acute intracranial abnormality, no evidence of acute intrathoracic abnormality. I agree with the radiologist interpretation.   Cardiac Monitoring:  The patient was maintained on a cardiac monitor.  My attending physician Dr. Jeraldine Loots, Dr. Anitra Lauth viewed and interpreted the cardiac monitored which showed an underlying rhythm of: Sinus rhythm with concerning ST elevations, no baseline to compare. I agree with this interpretation.  I evaluated this patient  after EKG was obtained Dr. Jeraldine Loots on initial arrival due initial EKG due to concerning ST elevations.  Patient was easily arousable, reported no active chest pain or chest pain prior to his syncopal event.  Given patient endorsed no chest pain initially Dr. Jeraldine Loots with no plan to call a code STEMI initially, raised suspicion for pericarditis versus myocarditis, will workup for seizure, versus syncope, and will obtain a cardiac workup.  Repeat EKG showing no evidence of STEMI, severe greater than 5 mm ST elevations improved, and new A-fib rhythm, still some diffuse ST changes, inferior leads.  Repeat EKG more concerning for A-fib, suspect possible holiday heart given his recent heavy alcohol use   Medications: I ordered medication including fluid bolus for suspected dehydration   Consultations Obtained: I requested consultation with the cardiologist, spoke with Dr. Lajean Manes especially given his initial EKG,  and discussed lab and imaging findings as well as pertinent plan on reassessment of initial EKG and description of his findings cardiologist plan to activate code STEMI.  I discussed this case with my attending physician Dr. Anitra Lauth who cosigned this note including patient's presenting symptoms, physical exam, and planned diagnostics and interventions. Attending physician stated agreement with plan or made changes to plan which were implemented.    Final  Clinical Impression(s) / ED Diagnoses Final diagnoses:  ST elevation myocardial infarction (STEMI), unspecified artery (HCC)  Syncope and collapse  Non-ST elevation (NSTEMI) myocardial infarction [facility])    Rx / DC Orders ED Discharge Orders     None         Olene Floss, PA-C 02/23/23 2356    Olene Floss, PA-C 02/24/23 0002    Gwyneth Sprout, MD 02/24/23 [phone]

## 2023-02-23 NOTE — ED Notes (Signed)
EKG performed at 2021. MD and PA at bedside to review EKG at this time.

## 2023-02-23 NOTE — ED Notes (Signed)
Notified cards provider and primary RN of critical trop

## 2023-02-24 ENCOUNTER — Encounter (HOSPITAL_COMMUNITY)
Admission: EM | Disposition: A | Discharge status: Home / Self Care | Payer: Self-pay | Source: Home / Self Care | Attending: Cardiology

## 2023-02-24 ENCOUNTER — Telehealth (HOSPITAL_COMMUNITY): Payer: Self-pay | Admitting: Pharmacy Technician

## 2023-02-24 ENCOUNTER — Inpatient Hospital Stay (HOSPITAL_COMMUNITY): Payer: Medicaid Other

## 2023-02-24 ENCOUNTER — Other Ambulatory Visit (HOSPITAL_COMMUNITY): Payer: Self-pay

## 2023-02-24 DIAGNOSIS — R079 Chest pain, unspecified: Secondary | ICD-10-CM | POA: Diagnosis not present

## 2023-02-24 DIAGNOSIS — I2510 Atherosclerotic heart disease of native coronary artery without angina pectoris: Secondary | ICD-10-CM | POA: Diagnosis present

## 2023-02-24 DIAGNOSIS — I10 Essential (primary) hypertension: Secondary | ICD-10-CM

## 2023-02-24 DIAGNOSIS — R55 Syncope and collapse: Secondary | ICD-10-CM

## 2023-02-24 DIAGNOSIS — I2121 ST elevation (STEMI) myocardial infarction involving left circumflex coronary artery: Secondary | ICD-10-CM

## 2023-02-24 DIAGNOSIS — I213 ST elevation (STEMI) myocardial infarction of unspecified site: Secondary | ICD-10-CM | POA: Diagnosis not present

## 2023-02-24 DIAGNOSIS — F17210 Nicotine dependence, cigarettes, uncomplicated: Secondary | ICD-10-CM | POA: Diagnosis present

## 2023-02-24 DIAGNOSIS — I472 Ventricular tachycardia, unspecified: Secondary | ICD-10-CM | POA: Diagnosis present

## 2023-02-24 DIAGNOSIS — I493 Ventricular premature depolarization: Secondary | ICD-10-CM | POA: Diagnosis present

## 2023-02-24 DIAGNOSIS — I480 Paroxysmal atrial fibrillation: Secondary | ICD-10-CM | POA: Diagnosis present

## 2023-02-24 DIAGNOSIS — Z79899 Other long term (current) drug therapy: Secondary | ICD-10-CM | POA: Diagnosis not present

## 2023-02-24 DIAGNOSIS — R569 Unspecified convulsions: Secondary | ICD-10-CM | POA: Diagnosis present

## 2023-02-24 DIAGNOSIS — R0683 Snoring: Secondary | ICD-10-CM | POA: Diagnosis not present

## 2023-02-24 DIAGNOSIS — E782 Mixed hyperlipidemia: Secondary | ICD-10-CM | POA: Diagnosis present

## 2023-02-24 HISTORY — PX: LEFT HEART CATH AND CORONARY ANGIOGRAPHY: CATH118249

## 2023-02-24 HISTORY — PX: CORONARY/GRAFT ACUTE MI REVASCULARIZATION: CATH118305

## 2023-02-24 LAB — T4, FREE: Free T4: 1.23 ng/dL — ABNORMAL HIGH (ref 0.61–1.12)

## 2023-02-24 LAB — CBC
HCT: 44.1 % (ref 39.0–52.0)
Hemoglobin: 15 g/dL (ref 13.0–17.0)
MCH: 30.7 pg (ref 26.0–34.0)
MCHC: 34 g/dL (ref 30.0–36.0)
MCV: 90.4 fL (ref 80.0–100.0)
Platelets: 358 10*3/uL (ref 150–400)
RBC: 4.88 MIL/uL (ref 4.22–5.81)
RDW: 12.6 % (ref 11.5–15.5)
WBC: 13.2 10*3/uL — ABNORMAL HIGH (ref 4.0–10.5)
nRBC: 0 % (ref 0.0–0.2)

## 2023-02-24 LAB — BASIC METABOLIC PANEL
Anion gap: 8 (ref 5–15)
BUN: 8 mg/dL (ref 6–20)
CO2: 24 mmol/L (ref 22–32)
Calcium: 8.3 mg/dL — ABNORMAL LOW (ref 8.9–10.3)
Chloride: 106 mmol/L (ref 98–111)
Creatinine, Ser: 1.06 mg/dL (ref 0.61–1.24)
GFR, Estimated: >60 mL/min (ref >60)
Glucose, Bld: 109 mg/dL — ABNORMAL HIGH (ref 70–99)
Potassium: 3.7 mmol/L (ref 3.5–5.1)
Sodium: 138 mmol/L (ref 135–145)

## 2023-02-24 LAB — LIPID PANEL
Cholesterol: 187 mg/dL (ref 0–200)
HDL: 33 mg/dL — ABNORMAL LOW (ref >40)
LDL Cholesterol: 142 mg/dL — ABNORMAL HIGH (ref 0–99)
Total CHOL/HDL Ratio: 5.7 {ratio}
Triglycerides: 59 mg/dL (ref <150)
VLDL: 12 mg/dL (ref 0–40)

## 2023-02-24 LAB — HEMOGLOBIN A1C
Hgb A1c MFr Bld: 4.5 % — ABNORMAL LOW (ref 4.8–5.6)
Mean Plasma Glucose: 82.45 mg/dL

## 2023-02-24 LAB — ECHOCARDIOGRAM COMPLETE
Calc EF: 49.1 %
Height: 74 in
S' Lateral: 3.6 cm
Single Plane A2C EF: 46.1 %
Single Plane A4C EF: 51.1 %
Weight: 4088.21 [oz_av]

## 2023-02-24 LAB — POCT ACTIVATED CLOTTING TIME
Activated Clotting Time: 285 s
Activated Clotting Time: 297 s

## 2023-02-24 LAB — CG4 I-STAT (LACTIC ACID): Lactic Acid, Venous: <0.3 mmol/L — ABNORMAL LOW (ref 0.5–1.9)

## 2023-02-24 LAB — MRSA NEXT GEN BY PCR, NASAL: MRSA by PCR Next Gen: NOT DETECTED

## 2023-02-24 LAB — TSH: TSH: 0.358 u[IU]/mL (ref 0.350–4.500)

## 2023-02-24 LAB — HIV ANTIBODY (ROUTINE TESTING W REFLEX): HIV Screen 4th Generation wRfx: NONREACTIVE

## 2023-02-24 SURGERY — CORONARY/GRAFT ACUTE MI REVASCULARIZATION
Anesthesia: LOCAL

## 2023-02-24 MED ORDER — ONDANSETRON HCL 4 MG/2ML IJ SOLN
INTRAMUSCULAR | Status: DC | PRN
  Administered 2023-02-24: 4 mg via INTRAVENOUS

## 2023-02-24 MED ORDER — APIXABAN 5 MG PO TABS
5.0000 mg | ORAL_TABLET | Freq: Two times a day (BID) | ORAL | Status: DC
  Administered 2023-02-24 – 2023-02-25 (×3): 5 mg via ORAL
  Filled 2023-02-24 (×3): qty 1

## 2023-02-24 MED ORDER — LOSARTAN POTASSIUM 25 MG PO TABS
25.0000 mg | ORAL_TABLET | Freq: Every day | ORAL | Status: DC
  Administered 2023-02-24 – 2023-02-25 (×2): 25 mg via ORAL
  Filled 2023-02-24 (×2): qty 1

## 2023-02-24 MED ORDER — HYDRALAZINE HCL 20 MG/ML IJ SOLN: 10.0000 mg | INTRAMUSCULAR | Status: AC | PRN

## 2023-02-24 MED ORDER — FENTANYL CITRATE (PF) 100 MCG/2ML IJ SOLN
INTRAMUSCULAR | Status: AC
  Filled 2023-02-24: qty 2

## 2023-02-24 MED ORDER — POTASSIUM CHLORIDE CRYS ER 20 MEQ PO TBCR
40.0000 meq | EXTENDED_RELEASE_TABLET | Freq: Once | ORAL | Status: AC
  Administered 2023-02-24: 40 meq via ORAL
  Filled 2023-02-24: qty 2

## 2023-02-24 MED ORDER — PHENYLEPHRINE 80 MCG/ML (10ML) SYRINGE FOR IV PUSH (FOR BLOOD PRESSURE SUPPORT)
PREFILLED_SYRINGE | INTRAVENOUS | Status: DC | PRN
  Administered 2023-02-24: 800 ug via INTRAVENOUS

## 2023-02-24 MED ORDER — FENTANYL CITRATE (PF) 100 MCG/2ML IJ SOLN
INTRAMUSCULAR | Status: DC | PRN
  Administered 2023-02-24: 25 ug via INTRAVENOUS

## 2023-02-24 MED ORDER — MIDAZOLAM HCL 2 MG/2ML IJ SOLN
INTRAMUSCULAR | Status: DC | PRN
  Administered 2023-02-24: 1 mg via INTRAVENOUS

## 2023-02-24 MED ORDER — SODIUM CHLORIDE 0.9 % IV SOLN: INTRAVENOUS | Status: AC

## 2023-02-24 MED ORDER — METOPROLOL SUCCINATE ER 25 MG PO TB24
25.0000 mg | ORAL_TABLET | Freq: Every day | ORAL | Status: DC
  Administered 2023-02-24 – 2023-02-25 (×2): 25 mg via ORAL
  Filled 2023-02-24 (×2): qty 1

## 2023-02-24 MED ORDER — ACETAMINOPHEN 325 MG PO TABS
650.0000 mg | ORAL_TABLET | ORAL | Status: DC | PRN
Start: 2023-02-24 — End: 2023-02-25

## 2023-02-24 MED ORDER — SODIUM CHLORIDE 0.9% FLUSH: 3.0000 mL | INTRAVENOUS | Status: DC | PRN

## 2023-02-24 MED ORDER — SODIUM CHLORIDE 0.9 % IV SOLN: 250.0000 mL | INTRAVENOUS | Status: AC | PRN

## 2023-02-24 MED ORDER — LIDOCAINE HCL (PF) 1 % IJ SOLN
INTRAMUSCULAR | Status: DC | PRN
  Administered 2023-02-24: 2 mL

## 2023-02-24 MED ORDER — SODIUM CHLORIDE 0.9% FLUSH
3.0000 mL | Freq: Two times a day (BID) | INTRAVENOUS | Status: DC
  Administered 2023-02-24: 3 mL via INTRAVENOUS

## 2023-02-24 MED ORDER — CLOPIDOGREL BISULFATE 75 MG PO TABS: 75.0000 mg | ORAL_TABLET | Freq: Every day | ORAL | Status: DC

## 2023-02-24 MED ORDER — HEPARIN SODIUM (PORCINE) 1000 UNIT/ML IJ SOLN
INTRAMUSCULAR | Status: DC | PRN
  Administered 2023-02-24 (×2): 4000 [IU] via INTRAVENOUS

## 2023-02-24 MED ORDER — CLOPIDOGREL BISULFATE 75 MG PO TABS
75.0000 mg | ORAL_TABLET | Freq: Every day | ORAL | Status: DC
  Administered 2023-02-25: 75 mg via ORAL
  Filled 2023-02-24: qty 1

## 2023-02-24 MED ORDER — METOPROLOL TARTRATE 25 MG PO TABS
25.0000 mg | ORAL_TABLET | Freq: Two times a day (BID) | ORAL | Status: DC
Start: 2023-02-24 — End: 2023-02-24
  Administered 2023-02-24: 25 mg via ORAL
  Filled 2023-02-24: qty 1

## 2023-02-24 MED ORDER — ONDANSETRON HCL 4 MG/2ML IJ SOLN
INTRAMUSCULAR | Status: AC
  Filled 2023-02-24: qty 2

## 2023-02-24 MED ORDER — VERAPAMIL HCL 2.5 MG/ML IV SOLN
INTRAVENOUS | Status: DC | PRN
  Administered 2023-02-24: 10 mL via INTRA_ARTERIAL

## 2023-02-24 MED ORDER — CLOPIDOGREL BISULFATE 300 MG PO TABS
ORAL_TABLET | ORAL | Status: DC | PRN
  Administered 2023-02-24: 600 mg via ORAL

## 2023-02-24 MED ORDER — SODIUM CHLORIDE 0.9 % IV BOLUS
INTRAVENOUS | Status: AC | PRN
  Administered 2023-02-24: 600 mL via INTRAVENOUS

## 2023-02-24 MED ORDER — LIDOCAINE HCL (PF) 1 % IJ SOLN
INTRAMUSCULAR | Status: AC
  Filled 2023-02-24: qty 30

## 2023-02-24 MED ORDER — IOHEXOL 350 MG/ML SOLN
INTRAVENOUS | Status: DC | PRN
  Administered 2023-02-24: 150 mL via INTRA_ARTERIAL

## 2023-02-24 MED ORDER — ONDANSETRON HCL 4 MG/2ML IJ SOLN: 4.0000 mg | Freq: Four times a day (QID) | INTRAMUSCULAR | Status: DC | PRN

## 2023-02-24 MED ORDER — ATORVASTATIN CALCIUM 80 MG PO TABS
80.0000 mg | ORAL_TABLET | Freq: Every day | ORAL | Status: DC
  Administered 2023-02-24 – 2023-02-25 (×2): 80 mg via ORAL
  Filled 2023-02-24 (×2): qty 1

## 2023-02-24 MED ORDER — CHLORHEXIDINE GLUCONATE CLOTH 2 % EX PADS
6.0000 | MEDICATED_PAD | Freq: Every day | CUTANEOUS | Status: DC
  Administered 2023-02-24 – 2023-02-25 (×2): 6 via TOPICAL

## 2023-02-24 MED ORDER — ASPIRIN 81 MG PO TBEC
81.0000 mg | DELAYED_RELEASE_TABLET | Freq: Every day | ORAL | Status: DC
Start: 2023-02-25 — End: 2023-02-25
  Administered 2023-02-25: 81 mg via ORAL
  Filled 2023-02-24: qty 1

## 2023-02-24 MED ORDER — NITROGLYCERIN 0.4 MG SL SUBL: 0.4000 mg | SUBLINGUAL_TABLET | SUBLINGUAL | Status: DC | PRN

## 2023-02-24 MED ORDER — HEPARIN SODIUM (PORCINE) 1000 UNIT/ML IJ SOLN
INTRAMUSCULAR | Status: AC
  Filled 2023-02-24: qty 10

## 2023-02-24 MED ORDER — PHENYLEPHRINE 80 MCG/ML (10ML) SYRINGE FOR IV PUSH (FOR BLOOD PRESSURE SUPPORT)
PREFILLED_SYRINGE | INTRAVENOUS | Status: AC
  Filled 2023-02-24: qty 10

## 2023-02-24 MED ORDER — HEPARIN (PORCINE) 25000 UT/250ML-% IV SOLN
1400.0000 [IU]/h | INTRAVENOUS | Status: DC
  Administered 2023-02-24: 1400 [IU]/h via INTRAVENOUS
  Filled 2023-02-24: qty 250

## 2023-02-24 MED ORDER — VERAPAMIL HCL 2.5 MG/ML IV SOLN
INTRAVENOUS | Status: AC
  Filled 2023-02-24: qty 2

## 2023-02-24 MED ORDER — SODIUM CHLORIDE 0.9 % IV SOLN
INTRAVENOUS | Status: AC | PRN
  Administered 2023-02-24: 10 mL/h via INTRAVENOUS

## 2023-02-24 MED ORDER — CLOPIDOGREL BISULFATE 300 MG PO TABS
ORAL_TABLET | ORAL | Status: AC
  Filled 2023-02-24: qty 2

## 2023-02-24 MED ORDER — MIDAZOLAM HCL 2 MG/2ML IJ SOLN
INTRAMUSCULAR | Status: AC
  Filled 2023-02-24: qty 2

## 2023-02-24 MED ORDER — HEPARIN (PORCINE) IN NACL 1000-0.9 UT/500ML-% IV SOLN
INTRAVENOUS | Status: DC | PRN
  Administered 2023-02-24 (×2): 500 mL

## 2023-02-24 MED ORDER — LABETALOL HCL 5 MG/ML IV SOLN: 10.0000 mg | INTRAVENOUS | Status: AC | PRN

## 2023-02-24 SURGICAL SUPPLY — 23 items
BALL SAPPHIRE NC24 2.75X15 (BALLOONS) ×1
BALLN EMERGE MR 2.5X12 (BALLOONS) ×1
BALLN EMERGE MR 3.0X15 (BALLOONS) ×1
BALLOON EMERGE MR 2.5X12 (BALLOONS) IMPLANT
BALLOON EMERGE MR 3.0X15 (BALLOONS) IMPLANT
BALLOON SAPPHIRE NC24 2.75X15 (BALLOONS) IMPLANT
CATH INFINITI 5 FR JL3.5 (CATHETERS) IMPLANT
CATH INFINITI AMBI 6FR TG (CATHETERS) IMPLANT
CATH VISTA GUIDE 6FR JL3.5 (CATHETERS) IMPLANT
DEVICE RAD COMP TR BAND LRG (VASCULAR PRODUCTS) IMPLANT
GLIDESHEATH SLEND SS 6F .021 (SHEATH) IMPLANT
GUIDEWIRE INQWIRE 1.5J.035X260 (WIRE) IMPLANT
INQWIRE 1.5J .035X260CM (WIRE) ×1
KIT ENCORE 26 ADVANTAGE (KITS) IMPLANT
KIT HEART LEFT (KITS) IMPLANT
KIT HEMO VALVE WATCHDOG (MISCELLANEOUS) IMPLANT
PACK CARDIAC CATHETERIZATION (CUSTOM PROCEDURE TRAY) ×1 IMPLANT
SHEATH PROBE COVER 6X72 (BAG) IMPLANT
STENT SYNERGY XD 2.75X24 (Permanent Stent) IMPLANT
SYNERGY XD 2.75X24 (Permanent Stent) ×1 IMPLANT
TRANSDUCER W/STOPCOCK (MISCELLANEOUS) IMPLANT
WIRE ASAHI PROWATER 180CM (WIRE) IMPLANT
WIRE HI TORQ WHISPER MS 190CM (WIRE) IMPLANT

## 2023-02-24 NOTE — Progress Notes (Addendum)
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  Suspicion for arrhythmogenic syncope.  No arrhythmia seen on telemetry other than PVCs. Continue telemetry for now.  STEMI: Initial EKG with ST elevation with subsequent resolution. Coronary angiogram showed occluded mid left circumflex, treated with Synergy 2.75 x 24 mm drug-eluting stent. Currently chest pain-free. Recommend aspirin and Plavix, along with Eliquis, see below. Will add losartan and metoprolol in the setting of hypertension and A-fib. LDL 142. Added Crestor 20 mg daily. Will follow up outpatient if she would need PCSK9i or Leqvio.  STEMI PCI around midnight. Will transfer to telemetry floor now. Will reassess later this afternoon, to decide if he is ready for discharge.   Paroxysmal A-fib: Rate minimally elevated.  Will add metoprolol succinate 25 mg daily, along with losartan 25 mg daily for hypertension control.   CHA2DS2-VASc or 2, annual stroke risk 2.2%. Will start Eliquis 5 mg twice daily.   He will need outpatient sleep study.   Hypertension: Mildly elevated, adding metoprolol and losartan as above.  Elder Negus, MD

## 2023-02-24 NOTE — ED Provider Notes (Signed)
Next day I was shown an EKG after this patient's arrival, provided only history of seizure, alcohol intake, no chest pain.  Initial ECG with abnormalities, including ST changes, artifact.  Recommendation for eval of the patient, with an effort to obtain more history, and repeat EKG. On chart review it seems that the patient's repeat EKG was abnormal with afib and ST changes but another subsequent study had more changes and code STEMI was activated.  Initial Tropon value was 19.  Patient had revascularization.   Gerhard Munch, MD 02/24/23 [phone]

## 2023-02-24 NOTE — Progress Notes (Signed)
PHARMACY - ANTICOAGULATION CONSULT NOTE  Pharmacy Consult for heparin > switch to Eliquis Indication: atrial fibrillation  No Known Allergies  Patient Measurements: Height: 6\' 2"  (188 cm) Weight: 115.9 kg (255 lb 8.2 oz) IBW/kg (Calculated) : 82.2 Heparin Dosing Weight: 110kg  Vital Signs: Temp: 98.1 F (36.7 C) (02/03 0700) Temp Source: Oral (02/03 0700) BP: 114/87 (02/03 0700) Pulse Rate: 99 (02/03 0700)  Labs: Recent Labs    02/23/23 2034 02/23/23 2136 02/23/23 2246 02/24/23 0348  HGB 16.3 16.0  --  15.0  HCT 49.4 47.0  --  44.1  PLT 409*  --   --  358  CREATININE 1.29* 1.10  --  1.06  TROPONINIHS 19*  --  163*  --     Estimated Creatinine Clearance: 127.9 mL/min (by C-G formula based on SCr of 1.06 mg/dL).   Medical History: Past Medical History:  Diagnosis Date   Asthma     Medications:  Medications Prior to Admission  Medication Sig Dispense Refill Last Dose/Taking   acetaminophen (TYLENOL) 500 MG tablet Take 2 tablets (1,000 mg total) by mouth every 8 (eight) hours as needed. 30 tablet 0 Unknown   Scheduled:   apixaban  5 mg Oral BID   [START ON 02/25/2023] aspirin EC  81 mg Oral Daily   atorvastatin  80 mg Oral Daily   Chlorhexidine Gluconate Cloth  6 each Topical Daily   [START ON 02/25/2023] clopidogrel  75 mg Oral Q breakfast   metoprolol tartrate  25 mg Oral BID   potassium chloride  40 mEq Oral Once   sodium chloride flush  3 mL Intravenous Q12H   Infusions:   sodium chloride      Assessment: 38yo male presented to ED for witnessed possible sz after EtOH use and donating plasma, on arrival to ED EKG concerning for ST elevations, repeat EKG showed no evidence of STEMI and new Afib, EDP d/w cards given presentation and pt proceeded to cath lab where a stent was placed for 100% occlusion of mLCx, now to start heparin for Afib with plan to transition to oral AC if Afib does not resolve in <24h.  Pharmacy asked to switch to po Eliquis this  AM.  Goal of Therapy:  Heparin level 0.3-0.7 units/ml Monitor platelets by anticoagulation protocol: Yes   Plan:  Stop IV heparin Start Eliquis 5 mg po BID. Will complete Eliquis education prior to discharge.  Reece Leader, Colon Flattery, [facility] Clinical Pharmacist  02/24/2023 8:47 AM   [facility] pharmacy phone numbers are listed on [email or web address]

## 2023-02-24 NOTE — Progress Notes (Signed)
PHARMACY - ANTICOAGULATION CONSULT NOTE  Pharmacy Consult for heparin Indication: atrial fibrillation  No Known Allergies  Patient Measurements: Height: 6\' 2"  (188 cm) Weight: 115.9 kg (255 lb 8.2 oz) IBW/kg (Calculated) : 82.2 Heparin Dosing Weight: 110kg  Vital Signs: Temp: 97.7 F (36.5 C) (02/03 0130) Temp Source: Oral (02/03 0130) BP: 127/95 (02/03 0500) Pulse Rate: 86 (02/03 0500)  Labs: Recent Labs    02/23/23 2034 02/23/23 2136 02/23/23 2246 02/24/23 0348  HGB 16.3 16.0  --  15.0  HCT 49.4 47.0  --  44.1  PLT 409*  --   --  358  CREATININE 1.29* 1.10  --  1.06  TROPONINIHS 19*  --  163*  --     Estimated Creatinine Clearance: 127.9 mL/min (by C-G formula based on SCr of 1.06 mg/dL).   Medical History: Past Medical History:  Diagnosis Date   Asthma     Medications:  Medications Prior to Admission  Medication Sig Dispense Refill Last Dose/Taking   acetaminophen (TYLENOL) 500 MG tablet Take 2 tablets (1,000 mg total) by mouth every 8 (eight) hours as needed. 30 tablet 0 Unknown   Scheduled:   [START ON 02/25/2023] aspirin EC  81 mg Oral Daily   atorvastatin  80 mg Oral Daily   Chlorhexidine Gluconate Cloth  6 each Topical Daily   [START ON 02/25/2023] clopidogrel  75 mg Oral Q breakfast   metoprolol tartrate  25 mg Oral BID   sodium chloride flush  3 mL Intravenous Q12H   Infusions:   sodium chloride 125 mL/hr at 02/24/23 0500   sodium chloride      Assessment: 38yo male presented to ED for witnessed possible sz after EtOH use and donating plasma, on arrival to ED EKG concerning for ST elevations, repeat EKG showed no evidence of STEMI and new Afib, EDP d/w cards given presentation and pt proceeded to cath lab where a stent was placed for 100% occlusion of mLCx, now to start heparin for Afib with plan to transition to oral AC if Afib does not resolve in <24h.  Goal of Therapy:  Heparin level 0.3-0.7 units/ml Monitor platelets by anticoagulation  protocol: Yes   Plan:  At 0730 start heparin infusion at 1400 units/hr. Monitor heparin levels and CBC.  Vernard Gambles, PharmD, BCPS  02/24/2023,5:59 AM

## 2023-02-24 NOTE — Telephone Encounter (Signed)
Patient Product/process development scientist completed.    The patient is insured through [facility] MEDICAID.     Ran test claim for Eliquis 5 mg and the current 30 day co-pay is $4.00.   This test claim was processed through [facility]- copay amounts may vary at other pharmacies due to pharmacy/plan contracts, or as the patient moves through the different stages of their insurance plan.     Jason Johnston, CPHT Pharmacy Technician III Certified Patient Advocate [facility] Patient Advocate Team Direct Number: [phone]  Fax: [phone]

## 2023-02-24 NOTE — TOC CM/SW Note (Signed)
Transition of Care [facility]) - Inpatient Brief Assessment   Patient Details  Name: Jason Johnston MRN: [id number] Date of Birth: 12-16-1985  Transition of Care [facility]) CM/SW Contact:    Gala Lewandowsky, RN Phone Number: 02/24/2023, 1:51 PM   Clinical Narrative: Patient presented for chest pain Stemi- post LHC. Plan for home on Plavix. Patient has Medicaid and cost is $4.00 for medications. Patient is without PCP and agreeable to Case Manager arranging an appointment. [facility], [facility], and [facility] are not accepting new patients at this time.  Appointment was scheduled for [facility] @ [facility] and information placed on the AVS. Spouse to transport patient home once stable. No further needs identified at this time.    Transition of Care Asessment: Insurance and Status: Insurance coverage has been reviewed Patient has primary care physician: No (Reaching out to surrounding clinics.) Home environment has been reviewed: reviewed Prior level of function:: independent Prior/Current Home Services: No current home services Social Drivers of Health Review: SDOH reviewed no interventions necessary Readmission risk has been reviewed: Yes Transition of care needs: no transition of care needs at this time

## 2023-02-24 NOTE — H&P (Addendum)
Cardiology Admission History and Physical   Patient ID: Jason Johnston MRN: [id number]; DOB: 1985/12/11   Admission date: 02/23/2023  PCP:  Patient, No Pcp Per   [facility] Providers Cardiologist:  None        Chief Complaint:  chest pain  Patient Profile:   Jason Johnston is a 38 y.o. male with substance use (alcohol, cannabis, tobacco) who is being seen 02/23/23 for the evaluation of chest pain.  History of Present Illness:   Jason Johnston 38 y.o. male with substance use (alcohol, cannabis, tobacco) who presented for chest pain. He was previously healthy with no chronic medical issues. He smokes tobacco and cannabis and does occasionally use alcohol. He works in a [employer] and previously had no exertional symptoms, chest pain, or dyspnea. Last night, he had several alcoholic drinks. Today, he went to donate plasma. After donating plasma, he felt lethargic and had to have a friend drive him home. When he got home, he started having "excruciating" substernal chest pressure that he describes as someone standing on his chest. The pain continued for 15 minutes until he had a syncopal episode that was witnessed by his significant other. She attempted to perform CPR and was able to give two chest compressions before he regained consciousness. She called EMS who transported him to the ED. He was initially altered and continued to be lethargic in the ED. He was AOx4 and able to give history however. His initial EKG on arrival to the ED on 02/23/23 at 20:21 showed atrial fibrillation with inferolateral STEMI and occasional PVCs. At this point the patient had reported resolution of his chest pain. Repeat EKG was obtained at 22:49 showed almost complete resolution of the ST elevation, with only minor elevation in lead III. Cardiology was consulted at 23:00 and cath lab was activated at 23:09. He currently has no chest pain or dyspnea although appears visibly diaphoretic and  uncomfortable. Initial troponin 19. CTH was negative. Alcohol level was negative. Bedside ultrasound shows inferolateral hypokinesis.    Past Medical History:  Diagnosis Date   Asthma     History reviewed. No pertinent surgical history.   Medications Prior to Admission: Prior to Admission medications   Medication Sig Start Date End Date Taking? Authorizing Provider  acetaminophen (TYLENOL) 500 MG tablet Take 2 tablets (1,000 mg total) by mouth every 8 (eight) hours as needed. 09/18/19   Darr, Gerilyn Pilgrim, PA-C     Allergies:   No Known Allergies  Social History:   Social History   Socioeconomic History   Marital status: Married    Spouse name: Not on file   Number of children: Not on file   Years of education: Not on file   Highest education level: Not on file  Occupational History   Not on file  Tobacco Use   Smoking status: Every Day    Current packs/day: 0.50    Types: Cigarettes   Smokeless tobacco: Not on file  Substance and Sexual Activity   Alcohol use: Yes    Alcohol/week: 1.0 standard drink of alcohol    Types: 1 Glasses of wine per week   Drug use: Yes    Types: Marijuana   Sexual activity: Not on file  Other Topics Concern   Not on file  Social History Narrative   Not on file   Social Drivers of Health   Financial Resource Strain: Not on file  Food Insecurity: Not on file  Transportation Needs: Not on file  Physical Activity: Not on file  Stress: Not on file  Social Connections: Not on file  Intimate Partner Violence: Not on file    Family History:   The patient's family history includes Healthy in his father and mother.    ROS:  Please see the history of present illness.  All other ROS reviewed and negative.     Physical Exam/Data:   Vitals:   02/23/23 2039 02/23/23 2130 02/23/23 2139 02/24/23 0003  BP:  (!) 129/96    Pulse:  87    Resp:  19    Temp: 98.4 F (36.9 C)     TempSrc: Rectal     SpO2:  93%  95%  Weight:   120.2 kg   Height:    6\' 2"  (1.88 m)     Intake/Output Summary (Last 24 hours) at 02/24/2023 0103 Last data filed at 02/23/2023 2243 Gross per 24 hour  Intake 1098.9 ml  Output --  Net 1098.9 ml      02/23/2023    9:39 PM 09/18/2019   12:01 PM 06/18/2018    4:36 PM  Last 3 Weights  Weight (lbs) 265 lb 252 lb 6.4 oz 255 lb  Weight (kg) 120.203 kg 114.488 kg 115.667 kg     Body mass index is 34.02 kg/m.  General:  Well nourished, well developed, diaphoretic, uncomfortable appearing HEENT: normal Neck: no JVD Vascular: No carotid bruits; Distal pulses 2+ bilaterally   Cardiac:  normal S1, S2; RRR; no murmur  Lungs:  clear to auscultation bilaterally, no wheezing, rhonchi or rales  Abd: soft, nontender, no hepatomegaly  Ext: no edema Musculoskeletal:  No deformities Skin: warm and dry  Neuro:  lethargic, no focal abnormalities noted Psych:  Normal affect     Laboratory Data:  High Sensitivity Troponin:   Recent Labs  Lab 02/23/23 2034 02/23/23 2246  TROPONINIHS 19* 163*      Chemistry Recent Labs  Lab 02/23/23 2034 02/23/23 2136  NA 137 141  K 4.6 3.6  CL 104 107  CO2 21*  --   GLUCOSE 146* 106*  BUN 10 8  CREATININE 1.29* 1.10  CALCIUM 8.6*  --   MG 1.9  --   GFRNONAA >60  --   ANIONGAP 12  --     Recent Labs  Lab 02/23/23 2034  PROT 6.1*  ALBUMIN 3.0*  AST 26  ALT 13  ALKPHOS 62  BILITOT 1.2   Lipids No results for input(s): "CHOL", "TRIG", "HDL", "LABVLDL", "LDLCALC", "CHOLHDL" in the last 168 hours. Hematology Recent Labs  Lab 02/23/23 2034 02/23/23 2136  WBC 13.2*  --   RBC 5.35  --   HGB 16.3 16.0  HCT 49.4 47.0  MCV 92.3  --   MCH 30.5  --   MCHC 33.0  --   RDW 12.6  --   PLT 409*  --    Thyroid No results for input(s): "TSH", "FREET4" in the last 168 hours. BNPNo results for input(s): "BNP", "PROBNP" in the last 168 hours.  DDimer No results for input(s): "DDIMER" in the last 168 hours.   Radiology/Studies:  DG Chest Portable 1 View Result Date:  02/23/2023 CLINICAL DATA:  Shortness of breath EXAM: PORTABLE CHEST 1 VIEW COMPARISON:  11/04/2012 FINDINGS: No consolidation or effusion. Diffuse bilateral somewhat reticular interstitial opacity. Normal cardiac size. No pneumothorax IMPRESSION: Diffuse bilateral somewhat reticular interstitial opacity, findings suspicious for infection, possible atypical pneumonia. Electronically Signed   By: Adrian Prows.D.  On: 02/23/2023 21:02   CT Head Wo Contrast Result Date: 02/23/2023 CLINICAL DATA:  Seizure, new-onset, no history of trauma a EXAM: CT HEAD WITHOUT CONTRAST TECHNIQUE: Contiguous axial images were obtained from the base of the skull through the vertex without intravenous contrast. RADIATION DOSE REDUCTION: This exam was performed according to the departmental dose-optimization program which includes automated exposure control, adjustment of the mA and/or kV according to patient size and/or use of iterative reconstruction technique. COMPARISON:  None Available. FINDINGS: Brain: No evidence of acute infarction, hemorrhage, hydrocephalus, extra-axial collection or mass lesion/mass effect. Vascular: No hyperdense vessel identified. Skull: No acute fracture. Sinuses/Orbits: Moderate left maxillary sinus mucosal thickening. No acute orbital findings. Other: No mastoid effusions. IMPRESSION: No evidence of acute intracranial abnormality. Electronically Signed   By: Feliberto Harts M.D.   On: 02/23/2023 20:57     Assessment and Plan:  38 y.o. male with substance use (alcohol, cannabis, tobacco) who presents with a new diagnosis of atrial fibrillation and inferolateral STEMI. LHC showed 100% occlusion of mLCx s/p PCI. Given appearance of lesion and presence of other atherosclerotic plaques seen, etiology of MI is favored to be atherosclerotic plaque rupture rather than cardioembolic from afib.  STEMI - aspirin 81 mg daily - plavix 75 mg daily - atorvastatin 80 mg daily - metoprolol 25 mg bid - TTE,  TSH, lipids, A1c, Lp(a), HIV screen  Atrial fibrillation - CHADS-VASc 1-2 (CAD, ?HTN) - metoprolol 25 mg bid - continue heparin gtt  - will need cardioversion prior to discharge if does not self-convert  Syncope - suspect ischemic VT - metoprolol as above - TTE to assess EF post revascularization   Risk Assessment/Risk Scores:    TIMI Risk Score for ST  Elevation MI:   The patient's TIMI risk score is 3, which indicates a 4.4% risk of all cause mortality at 30 days.     CHA2DS2-VASc Score =   1  This indicates a  % annual risk of stroke. The patient's score is based upon:       Code Status: Full Code  Severity of Illness: The appropriate patient status for this patient is INPATIENT. Inpatient status is judged to be reasonable and necessary in order to provide the required intensity of service to ensure the patient's safety. The patient's presenting symptoms, physical exam findings, and initial radiographic and laboratory data in the context of their chronic comorbidities is felt to place them at high risk for further clinical deterioration. Furthermore, it is not anticipated that the patient will be medically stable for discharge from the hospital within 2 midnights of admission.   * I certify that at the point of admission it is my clinical judgment that the patient will require inpatient hospital care spanning beyond 2 midnights from the point of admission due to high intensity of service, high risk for further deterioration and high frequency of surveillance required.*   For questions or updates, please contact [facility] Please consult [email or web address] for contact info under     Signed, Lorinda Creed  02/24/2023 1:03 AM

## 2023-02-24 NOTE — Plan of Care (Signed)

## 2023-02-24 NOTE — Discharge Instructions (Signed)
Information on my medicine - ELIQUIS® (apixaban) ° °This medication education was reviewed with me or my healthcare representative as part of my discharge preparation.  The pharmacist that spoke with me during my hospital stay was:  Sheranda Seabrooks C, RPH ° °Why was Eliquis® prescribed for you? °Eliquis® was prescribed for you to reduce the risk of a blood clot forming that can cause a stroke if you have a medical condition called atrial fibrillation (a type of irregular heartbeat). ° °What do You need to know about Eliquis® ? °Take your Eliquis® TWICE DAILY - one tablet in the morning and one tablet in the evening with or without food. If you have difficulty swallowing the tablet whole please discuss with your pharmacist how to take the medication safely. ° °Take Eliquis® exactly as prescribed by your doctor and DO NOT stop taking Eliquis® without talking to the doctor who prescribed the medication.  Stopping may increase your risk of developing a stroke.  Refill your prescription before you run out. ° °After discharge, you should have regular check-up appointments with your healthcare provider that is prescribing your Eliquis®.  In the future your dose may need to be changed if your kidney function or weight changes by a significant amount or as you get older. ° °What do you do if you miss a dose? °If you miss a dose, take it as soon as you remember on the same day and resume taking twice daily.  Do not take more than one dose of ELIQUIS at the same time to make up a missed dose. ° °Important Safety Information °A possible side effect of Eliquis® is bleeding. You should call your healthcare provider right away if you experience any of the following: °? Bleeding from an injury or your nose that does not stop. °? Unusual colored urine (red or dark brown) or unusual colored stools (red or black). °? Unusual bruising for unknown reasons. °? A serious fall or if you hit your head (even if there is no bleeding). ° °Some  medicines may interact with Eliquis® and might increase your risk of bleeding or clotting while on Eliquis®. To help avoid this, consult your healthcare provider or pharmacist prior to using any new prescription or non-prescription medications, including herbals, vitamins, non-steroidal anti-inflammatory drugs (NSAIDs) and supplements. ° °This website has more information on Eliquis® (apixaban): http://www.eliquis.com/eliquis/home °

## 2023-02-25 ENCOUNTER — Encounter: Payer: Self-pay | Admitting: *Deleted

## 2023-02-25 ENCOUNTER — Telehealth: Payer: Self-pay | Admitting: Cardiology

## 2023-02-25 ENCOUNTER — Other Ambulatory Visit (HOSPITAL_COMMUNITY): Payer: Self-pay

## 2023-02-25 ENCOUNTER — Other Ambulatory Visit: Payer: Self-pay | Admitting: Cardiology

## 2023-02-25 ENCOUNTER — Encounter (HOSPITAL_COMMUNITY): Payer: Self-pay | Admitting: Cardiology

## 2023-02-25 DIAGNOSIS — R0683 Snoring: Secondary | ICD-10-CM

## 2023-02-25 DIAGNOSIS — I480 Paroxysmal atrial fibrillation: Secondary | ICD-10-CM | POA: Diagnosis not present

## 2023-02-25 DIAGNOSIS — I2121 ST elevation (STEMI) myocardial infarction involving left circumflex coronary artery: Secondary | ICD-10-CM | POA: Diagnosis not present

## 2023-02-25 LAB — CBC
HCT: 40.4 % (ref 39.0–52.0)
Hemoglobin: 13.6 g/dL (ref 13.0–17.0)
MCH: 30.8 pg (ref 26.0–34.0)
MCHC: 33.7 g/dL (ref 30.0–36.0)
MCV: 91.6 fL (ref 80.0–100.0)
Platelets: 324 10*3/uL (ref 150–400)
RBC: 4.41 MIL/uL (ref 4.22–5.81)
RDW: 12.5 % (ref 11.5–15.5)
WBC: 10 10*3/uL (ref 4.0–10.5)
nRBC: 0 % (ref 0.0–0.2)

## 2023-02-25 LAB — LIPOPROTEIN A (LPA): Lipoprotein (a): 346.2 nmol/L — ABNORMAL HIGH (ref <75.0)

## 2023-02-25 MED ORDER — METOPROLOL SUCCINATE ER 25 MG PO TB24
25.0000 mg | ORAL_TABLET | Freq: Every day | ORAL | 3 refills | Status: DC
  Filled 2023-02-25: qty 90, 90d supply, fill #0

## 2023-02-25 MED ORDER — APIXABAN 5 MG PO TABS
5.0000 mg | ORAL_TABLET | Freq: Two times a day (BID) | ORAL | 3 refills | Status: DC
  Filled 2023-02-25: qty 60, 30d supply, fill #0

## 2023-02-25 MED ORDER — ATORVASTATIN CALCIUM 80 MG PO TABS
80.0000 mg | ORAL_TABLET | Freq: Every day | ORAL | 3 refills | Status: DC
  Filled 2023-02-25: qty 90, 90d supply, fill #0

## 2023-02-25 MED ORDER — CLOPIDOGREL BISULFATE 75 MG PO TABS
75.0000 mg | ORAL_TABLET | Freq: Every day | ORAL | 3 refills | Status: DC
  Filled 2023-02-25: qty 90, 90d supply, fill #0

## 2023-02-25 MED ORDER — ASPIRIN 81 MG PO TBEC
81.0000 mg | DELAYED_RELEASE_TABLET | Freq: Every day | ORAL | 12 refills | Status: AC
  Filled 2023-02-25: qty 30, 30d supply, fill #0

## 2023-02-25 MED ORDER — LOSARTAN POTASSIUM 25 MG PO TABS
25.0000 mg | ORAL_TABLET | Freq: Every day | ORAL | 3 refills | Status: DC
  Filled 2023-02-25: qty 90, 90d supply, fill #0

## 2023-02-25 MED ORDER — NITROGLYCERIN 0.4 MG SL SUBL
0.4000 mg | SUBLINGUAL_TABLET | SUBLINGUAL | 2 refills | Status: DC | PRN
  Filled 2023-02-25: qty 25, 9d supply, fill #0

## 2023-02-25 NOTE — Discharge Summary (Addendum)
 Discharge Summary     Physician Discharge Summary  Patient ID: Jason Johnston MRN: [id number] DOB/AGE: Jun 06, 1985 38 y.o.  Admit date: 02/23/2023 Discharge date: 02/25/2023  Primary Discharge Diagnosis: STEMI Paroxysmal atrial fibrillation Hypertension Syncope  Secondary Discharge Diagnosis: Tobacco abuse   Hospital Course:   38 year old male with nicotine dependence, admitted with chest pain, transient ST elevation, syncope, new diagnosis hypertension and paroxysmal A-fib.  Patient had crushing left-sided chest pain before syncope.  On arrival, reportedly was not having chest pain.  Code STEMI was called at the time of first EKG at 20: 21.  Code STEMI was called subsequently at 22: 49.  He was not having active chest pain at that time and EKG changes are improved, I was concerned that inciting event could have been a STEMI with possibility of arrhythmogenic syncope although not caught on EKG.  Therefore, I decided to take patient to Cath Lab.Coronary angiogram showed completely occluded mid circumflex that was successfully revascularized with Synergy 2.75 x 24 mm DES. Rest of the hospital stay was unremarkable. On the morning of discharge, patient ambulated without any chest pain, dyspnea. Barring occasional 3-4 beast NSVT and Afib as documented below, he did not have any other arrhythmia.   STEMI: Culprit vessel mid circumflex occlusion, rest nonobstructive disease. Successful primary PCI with Synergy 2.75 x 20 mm drug-eluting stent. Needs DAPT for 1 year, see below discussion regarding triple therapy. See below regarding lipid management. See below regarding hypertension management.  Paroxysmal atrial fibrillation: New diagnosis on presentation around 8 PM on 02/23/2023. Patient converted to sinus rhythm around 2: 30 PM on 02/24/2023. It is possible that A-fib was ischemic in the setting of STEMI.  However, onset of A-fib is unclear. He does have snoring, recommend outpatient  sleep study. Given that blood pressure has been otherwise not elevated, his CHA2DS2-VASc or could be considered 1 with annual stroke risk of <1%. We could consider 4 to 6 weeks anticoagulation with Eliquis .  If he has no recurrence of A-fib on 30-day event monitor to be placed outpatient, we could consider discontinuation of Eliquis  after that. Given the need for Eliquis  at this time, recommend aspirin  and Plavix  as dual antiplatelet therapy. If he comes off Eliquis , recommend continuing aspirin  with Plavix  or Brilinta till 02/2024. If he does not come off Eliquis , consider stopping aspirin  in 03/2023, and continue Plavix  and Eliquis . Given concern for STEMI as inciting cause for possible arrhythmogenic syncope, we will proceed with urgent coronary angiography and possible intervention. I have continued metoprolol  succinate 25 mg daily and losartan  25 mg daily given initial presentation with elevated blood pressure and A-fib.  It is possible that he does not truly have hypertension and may have had elevated blood pressure only in the setting of chest pain and STEMI.  That said, with wall motion abnormalities with mild hypokinesis as well as inferolateral dyskinesis, reasonable to continue metoprolol  and losartan .  EF is preserved at 50-55%.  In future, if wall motion abnormalities and EF did not improve, could consider switching losartan  to Entresto.  Mixed hyperlipidemia: Discussed diet and lifestyle modification. LDL 142, now started on Lipitor 80 mg daily.  Lipoprotein a pending. He may benefit from early initiation of PCSK9 inhibitor or inclisiran if LDL does not come down <55.   Nicotine dependence: Counseled regarding complete tobacco cessation. Also counseled regarding reducing alcohol intake.  Did the patient have an acute coronary syndrome (MI, NSTEMI, STEMI, etc) this admission?: Yes  AHA/ACC ACS Clinical Performance & Quality Measures: Aspirin  prescribed? -  Yes ADP Receptor Inhibitor (Plavix /Clopidogrel , Brilinta/Ticagrelor or Effient/Prasugrel) prescribed (includes medically managed patients)? - Yes Beta Blocker prescribed? - Yes High Intensity Statin (Lipitor 40-80mg  or Crestor 20-40mg ) prescribed? - Yes EF assessed during THIS hospitalization? - Yes For EF <40%, was ACEI/ARB prescribed? - Not Applicable (EF >/= 40%) For EF <40%, Aldosterone Antagonist (Spironolactone or Eplerenone) prescribed? - Not Applicable (EF >/= 40%) Cardiac Rehab Phase II ordered (including medically managed patients)? - Yes   Discharge Exam: Blood pressure 104/68, pulse 91, temperature 97.6 F (36.4 C), temperature source Oral, resp. rate (!) 21, height 6' 2 (1.88 m), weight 115.9 kg, SpO2 95%.   Physical Exam Vitals and nursing note reviewed.  Constitutional:      General: He is not in acute distress. Neck:     Vascular: No JVD.  Cardiovascular:     Rate and Rhythm: Normal rate and regular rhythm.     Heart sounds: Normal heart sounds. No murmur heard. Pulmonary:     Effort: Pulmonary effort is normal.     Breath sounds: Normal breath sounds. No wheezing or rales.  Musculoskeletal:     Right lower leg: No edema.     Left lower leg: No edema.      Recommendations on discharge:  Outpatient 30-day monitor Outpatient sleep study  Outpatient lipid clinic follow-up Outpatient cardiology follow-up   Significant Diagnostic Studies: Personally reviewed  Independently interpreted EKG 02/25/2023: Sinus rhythm 76 bpm Nonspecific T wave abnormality  Echocardiogram 02/24/2023: Independently interpreted LVEF 50-55%.  Mild hypokinesis of basal inferior inferolateral wall, dyskinesis of mid inferolateral  Coronary intervention 02/24/2023: LM: Normal LAD: No significant disease Lcx: Large OM1 with minimal 10% disease.        Mid vessel 100% occlusion after large OM1, and small AV groove circumflex artery. RCA: Large dominant vessel, no significant disease    LVEDP 12 mmHg   Successful percutaneous coronary intervention mid LCx        PTCA and stent placement 2.75 X 24 mm Synergy drug-eluting stent       FOLLOW UP PLANS AND APPOINTMENTS Discharge Instructions     AMB Referral to Cardiac Rehabilitation - Phase II   Complete by: As directed    Diagnosis: STEMI   After initial evaluation and assessments completed: Virtual Based Care may be provided alone or in conjunction with Phase 2 Cardiac Rehab based on patient barriers.: Yes   Intensive Cardiac Rehabilitation (ICR) [facility] location only OR Traditional Cardiac Rehabilitation (TCR) *If criteria for ICR are not met will enroll in TCR ([facility] only): Yes      Allergies as of 02/25/2023   No Known Allergies      Medication List     STOP taking these medications    acetaminophen  500 MG tablet Commonly known as: TYLENOL        TAKE these medications    apixaban  5 MG Tabs tablet Commonly known as: ELIQUIS  Take 1 tablet (5 mg total) by mouth 2 (two) times daily.   aspirin  EC 81 MG tablet Take 1 tablet (81 mg total) by mouth daily. Swallow whole.   atorvastatin  80 MG tablet Commonly known as: LIPITOR Take 1 tablet (80 mg total) by mouth daily.   clopidogrel  75 MG tablet Commonly known as: PLAVIX  Take 1 tablet (75 mg total) by mouth daily with breakfast. Start taking on: February 26, 2023   losartan  25 MG tablet Commonly known as: COZAAR  Take 1 tablet (25 mg  total) by mouth daily.   metoprolol  succinate 25 MG 24 hr tablet Commonly known as: TOPROL -XL Take 1 tablet (25 mg total) by mouth daily.   nitroGLYCERIN  0.4 MG SL tablet Commonly known as: NITROSTAT  Place 1 tablet (0.4 mg total) under the tongue every 5 (five) minutes x 3 doses as needed for chest pain.       I spent 30 minutes with the discharge process for patient  Jason Johnston today, including Reviewing discharge medications Complex decision making regarding medication management Reviewed discharge  recommendations with the patient Coordinating care with outpatient follow-up    Newman JINNY Lawrence, MD Pager: [phone] Office: [phone]

## 2023-02-25 NOTE — Telephone Encounter (Signed)
   Transition of Care Follow-up Phone Call Request    Patient Name: Jason Johnston Date of Birth: 1985/12/12 Date of Encounter: 02/25/2023  Primary Care Provider:  Patient, No Pcp Per Primary Cardiologist:  None  [name] has been scheduled for a transition of care follow up appointment with a [facility] provider:  [name] 2/11  Please reach out to [name] within 48 hours of discharge to confirm appointment and review transition of care protocol questionnaire. Anticipated discharge date: 2/4  Jason Rummer, NP  02/25/2023, 10:30 AM

## 2023-02-25 NOTE — Progress Notes (Signed)
Patient enrolled for [employer]/ [employer] to ship a 30 day cardiac event monitor to his address on file.  Letter with instructions mailed to patient.

## 2023-02-25 NOTE — Plan of Care (Signed)
  Problem: Education: Goal: Knowledge of General Education information will improve Description: Including pain rating scale, medication(s)/side effects and non-pharmacologic comfort measures Outcome: Completed/Met   Problem: Health Behavior/Discharge Planning: Goal: Ability to manage health-related needs will improve Outcome: Completed/Met   Problem: Clinical Measurements: Goal: Ability to maintain clinical measurements within normal limits will improve Outcome: Completed/Met Goal: Will remain free from infection Outcome: Completed/Met Goal: Diagnostic test results will improve Outcome: Completed/Met Goal: Respiratory complications will improve Outcome: Completed/Met Goal: Cardiovascular complication will be avoided Outcome: Completed/Met   Problem: Activity: Goal: Risk for activity intolerance will decrease Outcome: Completed/Met   Problem: Nutrition: Goal: Adequate nutrition will be maintained Outcome: Completed/Met   Problem: Coping: Goal: Level of anxiety will decrease Outcome: Completed/Met   Problem: Elimination: Goal: Will not experience complications related to bowel motility Outcome: Completed/Met Goal: Will not experience complications related to urinary retention Outcome: Completed/Met   

## 2023-02-25 NOTE — Progress Notes (Signed)
30d monitor ordered for PAF per Dr. Rosemary Holms

## 2023-02-25 NOTE — Progress Notes (Signed)
 CARDIAC REHAB PHASE I   PRE:  Rate/Rhythm: 90 SR  BP:  Sitting: 104/68      SaO2: 97 RA  MODE:  Ambulation: 290 ft   POST:  Rate/Rhythm: 85 SR  BP:  Sitting: 113/73      SaO2: 97 RA   Pt ambulated independently in hallway. Tolerated well with no CP, SOB or dizziness. Post MI/stent education including restrictions, risk factors, exercise guidelines, antiplatelet therapy importance, MI booklet, NTG use, heart healthy diet, smoking cessation and CRP2 reviewed. All questions and concerns addressed. Will refer to [facility]  for CRP2. Plan for discharge home later today.   [id number] Vaughn Asberry Hacking, RN BSN 02/25/2023 9:15 AM

## 2023-02-25 NOTE — Telephone Encounter (Signed)
Unable to leave message voice mail full.

## 2023-02-28 NOTE — Telephone Encounter (Signed)
 Patient contacted regarding discharge from [facility] on 02/25/23.  Patient understands to follow up with provider Jackee Alberts, NP on 03/04/23 at 8:50 am at [facility]. Patient understands discharge instructions? yes Patient understands medications and regiment? yes Patient understands to bring all medications to this visit? yes

## 2023-03-03 ENCOUNTER — Encounter: Payer: Self-pay | Admitting: General Practice

## 2023-03-03 ENCOUNTER — Telehealth (HOSPITAL_COMMUNITY): Payer: Self-pay

## 2023-03-03 ENCOUNTER — Telehealth: Payer: Self-pay | Admitting: General Practice

## 2023-03-03 ENCOUNTER — Inpatient Hospital Stay: Payer: BLUE CROSS/BLUE SHIELD | Admitting: Family Medicine

## 2023-03-03 NOTE — Progress Notes (Deleted)
 Cardiology Office Note    Patient Name: Jason Johnston Date of Encounter: 03/03/2023  Primary Care Provider:  Patient, No Pcp Per Primary Cardiologist:  None Primary Electrophysiologist: None   Past Medical History    Past Medical History:  Diagnosis Date   Asthma     History of Present Illness  Jason Johnston is a 38 y.o. male with a PMH of CAD s/p inferior STEMI treated with PCI/DES to 100% occluded mid circumflex, paroxysmal AF, HTN, HLD, tobacco abuse, syncope who presents today for post PCI follow-up.  Jason Johnston was admitted on 02/24/2023 after experiencing crushing chest pain and syncope and found to have inferior lateral STEMI with telemetry showing atrial fibrillation with occasional PVCs.  Jason Johnston underwent urgent LHC that showed large percent mid OM1 treated with PCI/DES x 1 with no significant disease in LAD or RCA.  Jason Johnston was started on triple therapy with Brilinta, Eliquis, and ASA.  Jason Johnston was ordered and shipped a 30-day event monitor with plan to discontinue Eliquis if no recurrence of AF or continue Eliquis and discontinue ASA in 1 month if AF burden indicated.  2D echo was completed showing EF of 50-55% with mild focal hypokinesis and basilar inferior and inferior lateral wall with dyskinesia of the mid inferior lateral wall.  Jason Johnston was discharged with Plavix instead of Brilinta along with losartan and metoprolol with atorvastatin.  Jason Johnston will need to repeat LFT and lipids in 8 weeks.  During today's visit the patient reports*** .  Patient denies chest pain, palpitations, dyspnea, PND, orthopnea, nausea, vomiting, dizziness, syncope, edema, weight gain, or early satiety.  ***Notes: Consider outpatient sleep study and referral to lipid clinic due to elevated lipoprotein a. -Last ischemic evaluation: -Last echo: -Interim ED visits: Review of Systems  Please see the history of present illness.    All other systems reviewed and are otherwise negative except as noted above.  Physical  Exam    Wt Readings from Last 3 Encounters:  02/24/23 255 lb 8.2 oz (115.9 kg)  09/18/19 252 lb 6.4 oz (114.5 kg)  06/18/18 255 lb (115.7 kg)   [id number] were no vitals filed for this visit.,There is no height or weight on file to calculate BMI. GEN: Well nourished, well developed in no acute distress Neck: No JVD; No carotid bruits Pulmonary: Clear to auscultation without rales, wheezing or rhonchi  Cardiovascular: Normal rate. Regular rhythm. Normal S1. Normal S2.   Murmurs: There is no murmur.  ABDOMEN: Soft, non-tender, non-distended EXTREMITIES:  No edema; No deformity   EKG/LABS/ Recent Cardiac Studies   ECG personally reviewed by me today - ***  Risk Assessment/Calculations:   {Does this patient have ATRIAL FIBRILLATION?:[phone]}      Lab Results  Component Value Date   WBC 10.0 02/25/2023   HGB 13.6 02/25/2023   HCT 40.4 02/25/2023   MCV 91.6 02/25/2023   PLT 324 02/25/2023   Lab Results  Component Value Date   CREATININE 1.06 02/24/2023   BUN 8 02/24/2023   NA 138 02/24/2023   K 3.7 02/24/2023   CL 106 02/24/2023   CO2 24 02/24/2023   Lab Results  Component Value Date   CHOL 187 02/24/2023   HDL 33 (L) 02/24/2023   LDLCALC 142 (H) 02/24/2023   TRIG 59 02/24/2023   CHOLHDL 5.7 02/24/2023    Lab Results  Component Value Date   HGBA1C 4.5 (L) 02/24/2023   Assessment & Plan    1.  CAD: -s/p inferior lateral STEMI treated  with PCI/DES to OM1 and patient placed on triple therapy due to concurrent atrial fibrillation with Eliquis, Plavix, ASA.  2.  Paroxysmal AF: -New diagnosis on presentation and converted to sinus rhythm today.  Possible AF was ischemic in setting of STEMI -30-day event monitor shift for evaluation of AF burden with plan to discontinue Eliquis if low. -Patient's CHA2DS2-VASc score***  3.  Essential HTN: -Patient's blood pressure today was***  4.  Syncope and collapse: -Patient advised*** -30-day event monitor ordered  5.   Hyperlipidemia: -Patient's LP(a) elevated at 346.2 and will need referral to lipid clinic -Patient's LDL was 142 and was started on atorvastatin 80 mg -Patient will need***  6.  {The patient has an active order for outpatient cardiac rehabilitation.   Please indicate if the patient is ready to start. Do NOT delete this.  It will auto delete.  Refresh note, then sign.              Click here to document readiness and see contraindications.  :1}  Cardiac Rehabilitation Eligibility Assessment       Disposition: Follow-up with None or APP in *** months {Are you ordering a CV Procedure (e.g. stress test, cath, DCCV, TEE, etc)?   Press F2        :[id number]}   Signed, Napoleon Form, Leodis Rains, NP 03/03/2023, 10:38 AM [facility]

## 2023-03-03 NOTE — Telephone Encounter (Signed)
 Attempted to call patient in regards to Cardiac Rehab - unable to leave VM, VM box full.

## 2023-03-03 NOTE — Telephone Encounter (Signed)
 Called patient couldn't leave voicemail , will send out letter to call the office for an appointment

## 2023-03-04 ENCOUNTER — Ambulatory Visit: Payer: BLUE CROSS/BLUE SHIELD | Attending: Nurse Practitioner | Admitting: Nurse Practitioner

## 2023-03-04 DIAGNOSIS — I10 Essential (primary) hypertension: Secondary | ICD-10-CM

## 2023-03-04 DIAGNOSIS — E785 Hyperlipidemia, unspecified: Secondary | ICD-10-CM

## 2023-03-04 DIAGNOSIS — R55 Syncope and collapse: Secondary | ICD-10-CM

## 2023-03-04 DIAGNOSIS — I2510 Atherosclerotic heart disease of native coronary artery without angina pectoris: Secondary | ICD-10-CM

## 2023-03-04 DIAGNOSIS — I480 Paroxysmal atrial fibrillation: Secondary | ICD-10-CM

## 2023-03-04 DIAGNOSIS — Z720 Tobacco use: Secondary | ICD-10-CM

## 2023-03-05 ENCOUNTER — Encounter: Payer: Self-pay | Admitting: Nurse Practitioner

## 2023-04-06 NOTE — Progress Notes (Deleted)
 Cardiology Office Note    Patient Name: Jason Johnston Date of Encounter: 04/06/2023  Primary Care Provider:  Patient, No Pcp Per Primary Cardiologist:  None Primary Electrophysiologist: None   Past Medical History    Past Medical History:  Diagnosis Date   Asthma     History of Present Illness  Jason Johnston is a 39 y.o. male with a PMH of CAD s/p inferior STEMI treated with PCI/DES to 100% occluded mid circumflex, paroxysmal AF, HTN, HLD, tobacco abuse, syncope who presents today for post PCI follow-up.   Jason Johnston was admitted on 02/24/2023 after experiencing crushing chest pain and syncope and found to have inferior lateral STEMI with telemetry showing atrial fibrillation with occasional PVCs.  He underwent urgent LHC that showed large percent mid OM1 treated with PCI/DES x 1 with no significant disease in LAD or RCA.  He was started on triple therapy with Brilinta, Eliquis, and ASA.  He was ordered and shipped a 30-day event monitor with plan to discontinue Eliquis if no recurrence of AF or continue Eliquis and discontinue ASA in 1 month if AF burden indicated.  2D echo was completed showing EF of 50-55% with mild focal hypokinesis and basilar inferior and inferior lateral wall with dyskinesia of the mid inferior lateral wall.  He was discharged with Plavix instead of Brilinta along with losartan and metoprolol with atorvastatin.  He will need to repeat LFT and lipids in 8 weeks.   During today's visit the patient reports*** .  Patient denies chest pain, palpitations, dyspnea, PND, orthopnea, nausea, vomiting, dizziness, syncope, edema, weight gain, or early satiety.  ***Notes: Patient needs referral to lipid clinic due to elevated LP(a)-patient may also need referral for sleep study. -Last ischemic evaluation: -Last echo: -Interim ED visits: Review of Systems  Please see the history of present illness.    All other systems reviewed and are otherwise negative except as noted  above.  Physical Exam    Wt Readings from Last 3 Encounters:  02/24/23 255 lb 8.2 oz (115.9 kg)  09/18/19 252 lb 6.4 oz (114.5 kg)  06/18/18 255 lb (115.7 kg)   JY:NWGNF were no vitals filed for this visit.,There is no height or weight on file to calculate BMI. GEN: Well nourished, well developed in no acute distress Neck: No JVD; No carotid bruits Pulmonary: Clear to auscultation without rales, wheezing or rhonchi  Cardiovascular: Normal rate. Regular rhythm. Normal S1. Normal S2.   Murmurs: There is no murmur.  ABDOMEN: Soft, non-tender, non-distended EXTREMITIES:  No edema; No deformity   EKG/LABS/ Recent Cardiac Studies   ECG personally reviewed by me today - ***  Risk Assessment/Calculations:   {Does this patient have ATRIAL FIBRILLATION?:[phone]}      Lab Results  Component Value Date   WBC 10.0 02/25/2023   HGB 13.6 02/25/2023   HCT 40.4 02/25/2023   MCV 91.6 02/25/2023   PLT 324 02/25/2023   Lab Results  Component Value Date   CREATININE 1.06 02/24/2023   BUN 8 02/24/2023   NA 138 02/24/2023   K 3.7 02/24/2023   CL 106 02/24/2023   CO2 24 02/24/2023   Lab Results  Component Value Date   CHOL 187 02/24/2023   HDL 33 (L) 02/24/2023   LDLCALC 142 (H) 02/24/2023   TRIG 59 02/24/2023   CHOLHDL 5.7 02/24/2023    Lab Results  Component Value Date   HGBA1C 4.5 (L) 02/24/2023   Assessment & Plan    1..  CAD: -  s/p inferior lateral STEMI treated with PCI/DES to OM1 and patient placed on triple therapy due to concurrent atrial fibrillation with Eliquis, Plavix, ASA.   2.  Paroxysmal AF: -New diagnosis on presentation and converted to sinus rhythm today.  Possible AF was ischemic in setting of STEMI -30-day event monitor shift for evaluation of AF burden with plan to discontinue Eliquis if low. -Patient's CHA2DS2-VASc score***   3.  Essential HTN: -Patient's blood pressure today was***   4.  Syncope and collapse: -Patient advised*** -30-day event  monitor ordered   5.  Hyperlipidemia: -Patient's LP(a) elevated at 346.2 and will need referral to lipid clinic -Patient's LDL was 142 and was started on atorvastatin 80 mg -Patient will need***   6.    {The patient has an active order for outpatient cardiac rehabilitation.   Please indicate if the patient is ready to start. Do NOT delete this.  It will auto delete.  Refresh note, then sign.              Click here to document readiness and see contraindications.  :1}  Cardiac Rehabilitation Eligibility Assessment       Disposition: Follow-up with None or APP in *** months {Are you ordering a CV Procedure (e.g. stress test, cath, DCCV, TEE, etc)?   Press F2        :[id number]}   Signed, Napoleon Form, Leodis Rains, NP 04/06/2023, 1:56 PM [facility]

## 2023-04-07 ENCOUNTER — Ambulatory Visit: Payer: BLUE CROSS/BLUE SHIELD | Admitting: Nurse Practitioner

## 2023-04-07 DIAGNOSIS — E7841 Elevated Lipoprotein(a): Secondary | ICD-10-CM

## 2023-04-07 DIAGNOSIS — I2510 Atherosclerotic heart disease of native coronary artery without angina pectoris: Secondary | ICD-10-CM

## 2023-04-07 DIAGNOSIS — I10 Essential (primary) hypertension: Secondary | ICD-10-CM

## 2023-04-07 DIAGNOSIS — I480 Paroxysmal atrial fibrillation: Secondary | ICD-10-CM

## 2023-04-07 DIAGNOSIS — R55 Syncope and collapse: Secondary | ICD-10-CM

## 2023-04-08 NOTE — Progress Notes (Signed)
 Cardiology Office Note:  .   Date:  04/22/2023  ID:  Nelly Rout Ullman, DOB January 24, 1985, MRN [id number] PCP: Patient, No Pcp Per  [facility] Providers Cardiologist:  Elder Negus, MD    History of Present Illness: .   Jason Johnston is a 38 y.o. male with history of tobacco use admitted with STEMI with syncope 02/23/23 DES Cfx, PAF on presentation converted to NSR spontaneously. Sleep study recommended. BP elevated in the hospital started on metoprolol and losartan.  Patient missed several appts. Denies chest pain, dyspnea. Hasn't gone back to work as he was doing heavy lifting. Smoking 1 pk per week-trying to quit. Smoking marijauna on weekends, stopped drinking. No bleeding problems. Has been out of his meds for 7 days. Patient never received the monitor-sent to wrong address. Does missed martial arts.   ROS:    Studies Reviewed: Marland Kitchen         Prior CV Studies:     Echocardiogram 02/24/2023: Independently interpreted LVEF 50-55%.  Mild hypokinesis of basal inferior inferolateral wall, dyskinesis of mid inferolateral   Coronary intervention 02/24/2023: LM: Normal LAD: No significant disease Lcx: Large OM1 with minimal 10% disease.        Mid vessel 100% occlusion after large OM1, and small AV groove circumflex artery. RCA: Large dominant vessel, no significant disease   LVEDP 12 mmHg   Successful percutaneous coronary intervention mid LCx        PTCA and stent placement 2.75 X 24 mm Synergy drug-eluting stent          Risk Assessment/Calculations:    CHA2DS2-VASc Score = 2   This indicates a 2.2% annual risk of stroke. The patient's score is based upon: CHF History: 0 HTN History: 1 Diabetes History: 0 Stroke History: 0 Vascular Disease History: 1 Age Score: 0 Gender Score: 0        STOP-Bang Score:  3      Physical Exam:   VS:  BP 128/86   Pulse 88   Ht 6\' 1"  (1.854 m)   Wt 263 lb (119.3 kg)   SpO2 97%   BMI 34.70 kg/m    Wt Readings from Last 3  Encounters:  04/22/23 263 lb (119.3 kg)  02/24/23 255 lb 8.2 oz (115.9 kg)  09/18/19 252 lb 6.4 oz (114.5 kg)    GEN: Obese, in no acute distress NECK: No JVD; No carotid bruits CARDIAC:  RRR, no murmurs, rubs, gallops RESPIRATORY:  Clear to auscultation without rales, wheezing or rhonchi  ABDOMEN: Soft, non-tender, non-distended EXTREMITIES:  No edema; No deformity   ASSESSMENT AND PLAN: .    CAD STEMI DES Cfx on plavix, ASA and eliquis-stopped all his meds 7 days ago because he was running low on Eliquis. Long discussion about the importance of taking all his meds. No angina.  Syncope with chest pain suspicious for arrhythmogenic syncope nothing seen on tele. Monitor ordered but went to the wrong address. Will reorder today.   PAF on metoprolol and eliquis needs sleep study. If 30 day monitor shows no afib consider stopping eliquis. Sleep study ordered today  HTN on losartan and metoprolol-BP controlled and hasn't been taking his meds. Recheck on meds at next OV. May need to cut back on meds.  check bmet and cbc today  HLD LDL 142 started on crestor. Recheck labs today  Tobacco and marijuana use-trying to quit. Importance of cessation discussed.    Cardiac Rehabilitation Eligibility Assessment  The patient is ready  to start cardiac rehabilitation from a cardiac standpoint.       Dispo: f/u in 6 weeks  Signed, Jacolyn Reedy, PA-C

## 2023-04-22 ENCOUNTER — Ambulatory Visit: Attending: Physician Assistant | Admitting: Physician Assistant

## 2023-04-22 ENCOUNTER — Encounter: Payer: Self-pay | Admitting: Physician Assistant

## 2023-04-22 ENCOUNTER — Telehealth: Payer: Self-pay | Admitting: *Deleted

## 2023-04-22 VITALS — BP 128/86 | HR 88 | Ht 73.0 in | Wt 263.0 lb

## 2023-04-22 DIAGNOSIS — I480 Paroxysmal atrial fibrillation: Secondary | ICD-10-CM | POA: Diagnosis not present

## 2023-04-22 DIAGNOSIS — I10 Essential (primary) hypertension: Secondary | ICD-10-CM

## 2023-04-22 DIAGNOSIS — R55 Syncope and collapse: Secondary | ICD-10-CM

## 2023-04-22 DIAGNOSIS — E785 Hyperlipidemia, unspecified: Secondary | ICD-10-CM

## 2023-04-22 DIAGNOSIS — I2583 Coronary atherosclerosis due to lipid rich plaque: Secondary | ICD-10-CM

## 2023-04-22 DIAGNOSIS — I2510 Atherosclerotic heart disease of native coronary artery without angina pectoris: Secondary | ICD-10-CM

## 2023-04-22 DIAGNOSIS — Z720 Tobacco use: Secondary | ICD-10-CM

## 2023-04-22 MED ORDER — ATORVASTATIN CALCIUM 80 MG PO TABS
80.0000 mg | ORAL_TABLET | Freq: Every day | ORAL | 3 refills | Status: AC
Start: 2023-04-22 — End: ?

## 2023-04-22 MED ORDER — CLOPIDOGREL BISULFATE 75 MG PO TABS: 75.0000 mg | ORAL_TABLET | Freq: Every day | ORAL | 3 refills | Status: AC

## 2023-04-22 MED ORDER — METOPROLOL SUCCINATE ER 25 MG PO TB24
25.0000 mg | ORAL_TABLET | Freq: Every day | ORAL | 3 refills | Status: AC
Start: 2023-04-22 — End: ?

## 2023-04-22 MED ORDER — NITROGLYCERIN 0.4 MG SL SUBL: 0.4000 mg | SUBLINGUAL_TABLET | SUBLINGUAL | 5 refills | Status: AC | PRN

## 2023-04-22 MED ORDER — APIXABAN 5 MG PO TABS
5.0000 mg | ORAL_TABLET | Freq: Two times a day (BID) | ORAL | 11 refills | Status: AC
Start: 2023-04-22 — End: ?

## 2023-04-22 MED ORDER — LOSARTAN POTASSIUM 25 MG PO TABS
25.0000 mg | ORAL_TABLET | Freq: Every day | ORAL | 3 refills | Status: AC
Start: 2023-04-22 — End: ?

## 2023-04-22 NOTE — Patient Instructions (Addendum)
 Medication Instructions:  Your physician recommends that you continue on your current medications as directed. Please refer to the Current Medication list given to you today.  *If you need a refill on your cardiac medications before your next appointment, please call your pharmacy*  Lab Work: TODAY: CMET, CBC, FASTING LIPIDS  If you have labs (blood work) drawn today and your tests are completely normal, you will receive your results only by: MyChart Message (if you have MyChart) OR A paper copy in the mail If you have any lab test that is abnormal or we need to change your treatment, we will call you to review the results.  Testing/Procedures:  [employer] Cardiac Event Monitor Instructions  Your physician has requested you wear your cardiac event monitor for ___30__ days, [employer] may call or text to confirm a shipping address. The monitor will be sent to a land address via UPS. [employer] will not ship a monitor to a PO BOX. It typically takes 3-5 days to receive your monitor after it has been enrolled. [employer] will assist with USPS tracking if your package is delayed. The telephone number for [employer] is [phone]. Once you have received your monitor, please review the enclosed instructions. Instruction tutorials can also be viewed under help and settings on the enclosed cell phone. Your monitor has already been registered assigning a specific monitor serial # to you.  Billing and Self Pay Discount Information  [employer] has been provided the insurance information we had on file for you.  If your insurance has been updated, please call [employer] at [phone] to provide them with your updated insurance information.   [employer] offers a discounted Self Pay option for patients who have insurance that does not cover their cardiac event monitor or patients without insurance.  The discounted cost of a Self Pay Cardiac Event Monitor would be $225.00 , if the patient contacts  [employer] at [phone] within 7 days of applying the monitor to make payment arrangements.  If the patient does not contact [employer] within 7 days of applying the monitor, the cost of the cardiac event monitor will be $350.00.  Applying the monitor  Remove cell phone from case and turn it on. The cell phone works as IT consultant and needs to be within [employer] of you at all times. The cell phone will need to be charged on a daily basis. We recommend you plug the cell phone into the enclosed charger at your bedside table every night.  Monitor batteries: You will receive two monitor batteries labelled #1 and #2. These are your recorders. Plug battery #2 onto the second connection on the enclosed charger. Keep one battery on the charger at all times. This will keep the monitor battery deactivated. It will also keep it fully charged for when you need to switch your monitor batteries. A small light will be blinking on the battery emblem when it is charging. The light on the battery emblem will remain on when the battery is fully charged.  Open package of a Monitor strip. Insert battery #1 into black hood on strip and gently squeeze monitor battery onto connection as indicated in instruction booklet. Set aside while preparing skin.  Choose location for your strip, vertical or horizontal, as indicated in the instruction booklet. Shave to remove all hair from location. There cannot be any lotions, oils, powders, or colognes on skin where monitor is to be applied. Wipe skin clean with enclosed Saline wipe. Dry skin completely.  Peel paper labeled #1 off  the back of the Monitor strip exposing the adhesive. Place the monitor on the chest in the vertical or horizontal position shown in the instruction booklet. One arrow on the monitor strip must be pointing upward. Carefully remove paper labeled #2, attaching remainder of strip to your skin. Try not to create any folds or wrinkles in the strip  as you apply it.  Firmly press and release the circle in the center of the monitor battery. You will hear a small beep. This is turning the monitor battery on. The heart emblem on the monitor battery will light up every 5 seconds if the monitor battery in turned on and connected to the patient securely. Do not push and hold the circle down as this turns the monitor battery off. The cell phone will locate the monitor battery. A screen will appear on the cell phone checking the connection of your monitor strip. This may read poor connection initially but change to good connection within the next minute. Once your monitor accepts the connection you will hear a series of 3 beeps followed by a climbing crescendo of beeps. A screen will appear on the cell phone showing the two monitor strip placement options. Touch the picture that demonstrates where you applied the monitor strip.  Your monitor strip and battery are waterproof. You are able to shower, bathe, or swim with the monitor on. They just ask you do not submerge deeper than 3 feet underwater. We recommend removing the monitor if you are swimming in a lake, river, or ocean.  Your monitor battery will need to be switched to a fully charged monitor battery approximately once a week. The cell phone will alert you of an action which needs to be made.  On the cell phone, tap for details to reveal connection status, monitor battery status, and cell phone battery status. The green dots indicates your monitor is in good status. A red dot indicates there is something that needs your attention.  To record a symptom, click the circle on the monitor battery. In 30-60 seconds a list of symptoms will appear on the cell phone. Select your symptom and tap save. Your monitor will record a sustained or significant arrhythmia regardless of you clicking the button. Some patients do not feel the heart rhythm irregularities. [employer] will notify us of any  serious or critical events.  Refer to instruction booklet for instructions on switching batteries, changing strips, the Do not disturb or Pause features, or any additional questions.  Call [employer] at [phone], to confirm your monitor is transmitting and record your baseline. They will answer any questions you may have regarding the monitor instructions at that time.  Returning the monitor to [employer]  Place all equipment back into blue box. Peel off strip of paper to expose adhesive and close box securely. There is a prepaid UPS shipping label on this box. Drop in a UPS drop box, or at a UPS facility like [employer]. You may also contact [employer] to arrange UPS to pick up monitor package at your home.  Your physician has recommended that you have an itamar sleep study. This test records several body functions during sleep, including: brain activity, eye movement, oxygen and carbon dioxide blood levels, heart rate and rhythm, breathing rate and rhythm, the flow of air through your mouth and nose, snoring, body muscle movements, and chest and belly movement.  Follow-Up: At [facility], you and your health needs are our priority.  As part of our continuing mission  to provide you with exceptional heart care, our providers are all part of one team.  This team includes your primary Cardiologist (physician) and Advanced Practice Providers or APPs (Physician Assistants and Nurse Practitioners) who all work together to provide you with the care you need, when you need it.  Your next appointment:   6 week(s)  Provider:   Rosemary Holms   We recommend signing up for the patient portal called "MyChart".  Sign up information is provided on this After Visit Summary.  MyChart is used to connect with patients for Virtual Visits (Telemedicine).  Patients are able to view lab/test results, encounter notes, upcoming appointments, etc.  Non-urgent messages can be sent to your provider as well.    To learn more about what you can do with MyChart, go to [email or web address].   Other Instructions Steps to Quit Smoking Smoking tobacco is the leading cause of preventable death. It can affect almost every organ in the body. Smoking puts you and people around you at risk for many serious, long-lasting (chronic) diseases. Quitting smoking can be hard, but it is one of the best things that you can do for your health. It is never too late to quit. Do not give up if you cannot quit the first time. Some people need to try many times to quit. Do your best to stick to your quit plan, and talk with your doctor if you have any questions or concerns. How do I get ready to quit? Pick a date to quit. Set a date within the next 2 weeks to give you time to prepare. Write down the reasons why you are quitting. Keep this list in places where you will see it often. Tell your family, friends, and co-workers that you are quitting. Their support is important. Talk with your doctor about the choices that may help you quit. Find out if your health insurance will pay for these treatments. Know the people, places, things, and activities that make you want to smoke (triggers). Avoid them. What first steps can I take to quit smoking? Throw away all cigarettes at home, at work, and in your car. Throw away the things that you use when you smoke, such as ashtrays and lighters. Clean your car. Empty the ashtray. Clean your home, including curtains and carpets. What can I do to help me quit smoking? Talk with your doctor about taking medicines and seeing a counselor. You are more likely to succeed when you do both. If you are pregnant or breastfeeding: Talk with your doctor about counseling or other ways to quit smoking. Do not take medicine to help you quit smoking unless your doctor tells you to. Quit right away Quit smoking completely, instead of slowly cutting back on how much you smoke over a period of time.  Stopping smoking right away may be more successful than slowly quitting. Go to counseling. In-person is best if this is an option. You are more likely to quit if you go to counseling sessions regularly. Take medicine You may take medicines to help you quit. Some medicines need a prescription, and some you can buy over-the-counter. Some medicines may contain a drug called nicotine to replace the nicotine in cigarettes. Medicines may: Help you stop having the desire to smoke (cravings). Help to stop the problems that come when you stop smoking (withdrawal symptoms). Your doctor may ask you to use: Nicotine patches, gum, or lozenges. Nicotine inhalers or sprays. Non-nicotine medicine that you take by mouth. Find resources Find resources  and other ways to help you quit smoking and remain smoke-free after you quit. They include: Online chats with a Veterinary surgeon. Phone quitlines. Printed Materials engineer. Support groups or group counseling. Text messaging programs. Mobile phone apps. Use apps on your mobile phone or tablet that can help you stick to your quit plan. Examples of free services include Quit Guide from the CDC and smokefree.gov  What can I do to make it easier to quit?  Talk to your family and friends. Ask them to support and encourage you. Call a phone quitline, such as 1-800-QUIT-NOW, reach out to support groups, or work with a Veterinary surgeon. Ask people who smoke to not smoke around you. Avoid places that make you want to smoke, such as: Bars. Parties. Smoke-break areas at work. Spend time with people who do not smoke. Lower the stress in your life. Stress can make you want to smoke. Try these things to lower stress: Getting regular exercise. Doing deep-breathing exercises. Doing yoga. Meditating. What benefits will I see if I quit smoking? Over time, you may have: A better sense of smell and taste. Less coughing and sore throat. A slower heart rate. Lower blood  pressure. Clearer skin. Better breathing. Fewer sick days. Summary Quitting smoking can be hard, but it is one of the best things that you can do for your health. Do not give up if you cannot quit the first time. Some people need to try many times to quit. When you decide to quit smoking, make a plan to help you succeed. Quit smoking right away, not slowly over a period of time. When you start quitting, get help and support to keep you smoke-free. This information is not intended to replace advice given to you by your health care provider. Make sure you discuss any questions you have with your health care provider. Document Revised: 12/29/2020 Document Reviewed: 12/29/2020 Elsevier Patient Education  2024 Elsevier Inc.      1st Floor: - Lobby - Registration  - Pharmacy  - Lab - Cafe  2nd Floor: - PV Lab - Diagnostic Testing (echo, CT, nuclear med)  3rd Floor: - Vacant  4th Floor: - TCTS (cardiothoracic surgery) - AFib Clinic - Structural Heart Clinic - Vascular Surgery  - Vascular Ultrasound  5th Floor: - [facility]Care Cardiology (general and EP) - Clinical Pharmacy for coumadin, hypertension, lipid, weight-loss medications, and med management appointments    Valet parking services will be available as well.

## 2023-04-22 NOTE — Addendum Note (Signed)
 Addended by: Michaelle Copas on: 04/22/2023 09:22 AM   Modules accepted: Orders

## 2023-04-22 NOTE — Telephone Encounter (Signed)
 Jason Johnston, PAC ORDERED Jason Johnston.   Patient agreement reviewed and signed on 04/22/2023.  WatchPAT issued to patient on 04/22/2023 by Danielle Rankin, CMA. Patient aware to not open the WatchPAT box until contacted with the activation PIN. Patient profile initialized in CloudPAT on 04/22/2023 by Danielle Rankin, CMA,. Device serial number: [id number]  Please list Reason for Call as Advice Only and type "WatchPAT issued to patient" in the comment box.

## 2023-04-24 ENCOUNTER — Encounter (HOSPITAL_COMMUNITY): Payer: Self-pay

## 2023-04-24 ENCOUNTER — Telehealth (HOSPITAL_COMMUNITY): Payer: Self-pay

## 2023-04-24 NOTE — Telephone Encounter (Signed)
Attempted to call patient in regards to Cardiac Rehab - unable to leave VM, VM box full. °  °Mailed letter °

## 2023-05-14 ENCOUNTER — Other Ambulatory Visit (HOSPITAL_COMMUNITY): Payer: Self-pay

## 2023-05-15 ENCOUNTER — Telehealth (HOSPITAL_COMMUNITY): Payer: Self-pay

## 2023-05-15 NOTE — Telephone Encounter (Signed)
No response from pt in regards to cardiac rehab. Closed referral 

## 2023-06-03 ENCOUNTER — Ambulatory Visit: Attending: Cardiology | Admitting: Cardiology

## 2023-06-03 NOTE — Progress Notes (Deleted)
  Cardiology Office Note:  .   Date:  06/03/2023  ID:  Edger Goody Hiser, DOB 1985/08/31, MRN [id number] PCP: Patient, No Pcp Per  [facility] Providers Cardiologist:  Fransico Ivy, MD PCP: Patient, No Pcp Per  No chief complaint on file.    Christorpher D Cranmer is a 38 y.o. male with *** Discussed the use of AI scribe software for clinical note transcription with the patient, who gave verbal consent to proceed.  History of Present Illness       There were no vitals filed for this visit.    ROS      Studies Reviewed: .        *** Independently interpreted ***/202***: Chol ***, TG ***, HDL ***, LDL *** HbA1C ***% Hb *** Cr *** ***  Risk Assessment/Calculations:   {Does this patient have ATRIAL FIBRILLATION?:[phone]}    Physical Exam   VISIT DIAGNOSES: No diagnosis found.   Ayman D Grilliot is a 38 y.o. male with *** Assessment and Plan Assessment & Plan       {Are you ordering a CV Procedure (e.g. stress test, cath, DCCV, TEE, etc)?   Press F2        :[id number]}    No orders of the defined types were placed in this encounter.    F/u in ***  Signed, Cody Das, MD

## 2023-07-01 ENCOUNTER — Telehealth: Payer: Self-pay

## 2023-07-01 NOTE — Telephone Encounter (Signed)
 Ordering provider: Theotis Flake  Associated diagnoses: I48.0 ( Paroxysmal Atrial Fibrillation) I21.3 (ST elevation  myocardial infarction of unspecified site) I10 ( Essential hypertension) WatchPAT PA obtained on 07/01/2023 by Lodema Rimes, CMA. Authorization: Per [facility] Provider portal: Prior Authorization/Notification is not required for the requested service(s)  Decision ID #: [id number] Patient NOT notified of PIN (1234) on 07/01/2023 because phone not currently in service.   Phone note routed to covering staff for follow-up.
